# Patient Record
Sex: Female | Born: 1990 | Race: White | Hispanic: No | Marital: Married | Smoking: Former smoker
Health system: Southern US, Community
[De-identification: ages and names within clinical notes are randomized; demographics above are authoritative.]

## PROBLEM LIST (undated history)

## (undated) DIAGNOSIS — R51 Headache: Secondary | ICD-10-CM

## (undated) DIAGNOSIS — G43909 Migraine, unspecified, not intractable, without status migrainosus: Secondary | ICD-10-CM

## (undated) DIAGNOSIS — F329 Major depressive disorder, single episode, unspecified: Secondary | ICD-10-CM

## (undated) DIAGNOSIS — F419 Anxiety disorder, unspecified: Secondary | ICD-10-CM

## (undated) DIAGNOSIS — E039 Hypothyroidism, unspecified: Secondary | ICD-10-CM

## (undated) HISTORY — DX: Major depressive disorder, single episode, unspecified: F32.9

## (undated) HISTORY — PX: WISDOM TOOTH EXTRACTION: SHX21

## (undated) HISTORY — DX: Headache: R51

## (undated) HISTORY — DX: Anxiety disorder, unspecified: F41.9

---
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
## 2008-07-23 ENCOUNTER — Other Ambulatory Visit: Admission: RE | Admit: 2008-07-23 | Discharge: 2008-07-23 | Payer: Self-pay | Admitting: Obstetrics and Gynecology

## 2009-03-08 ENCOUNTER — Encounter: Admission: RE | Admit: 2009-03-08 | Discharge: 2009-03-08 | Payer: Self-pay | Admitting: Endocrinology

## 2009-11-26 ENCOUNTER — Emergency Department (HOSPITAL_COMMUNITY): Admission: EM | Admit: 2009-11-26 | Discharge: 2009-11-26 | Payer: Self-pay | Admitting: Emergency Medicine

## 2010-11-20 ENCOUNTER — Encounter: Payer: Self-pay | Admitting: Specialist

## 2010-11-20 ENCOUNTER — Encounter: Payer: Self-pay | Admitting: Endocrinology

## 2010-12-23 ENCOUNTER — Other Ambulatory Visit: Payer: Self-pay | Admitting: Endocrinology

## 2011-01-09 ENCOUNTER — Other Ambulatory Visit: Payer: Self-pay | Admitting: Endocrinology

## 2011-01-09 DIAGNOSIS — E049 Nontoxic goiter, unspecified: Secondary | ICD-10-CM

## 2011-01-16 LAB — CBC
HCT: 32.3 % — ABNORMAL LOW (ref 36.0–46.0)
Hemoglobin: 10.5 g/dL — ABNORMAL LOW (ref 12.0–15.0)
MCHC: 32.5 g/dL (ref 30.0–36.0)
MCV: 87.6 fL (ref 78.0–100.0)
Platelets: 230 10*3/uL (ref 150–400)
RBC: 3.69 MIL/uL — ABNORMAL LOW (ref 3.87–5.11)
RDW: 13.9 % (ref 11.5–15.5)
WBC: 6.9 10*3/uL (ref 4.0–10.5)

## 2011-01-16 LAB — URINE CULTURE
Colony Count: NO GROWTH
Culture: NO GROWTH

## 2011-01-16 LAB — DIFFERENTIAL
Basophils Absolute: 0 10*3/uL (ref 0.0–0.1)
Basophils Relative: 1 % (ref 0–1)
Eosinophils Absolute: 0.1 10*3/uL (ref 0.0–0.7)
Eosinophils Relative: 1 % (ref 0–5)
Lymphocytes Relative: 42 % (ref 12–46)
Lymphs Abs: 2.8 10*3/uL (ref 0.7–4.0)
Monocytes Absolute: 0.3 10*3/uL (ref 0.1–1.0)
Monocytes Relative: 5 % (ref 3–12)
Neutro Abs: 3.6 10*3/uL (ref 1.7–7.7)
Neutrophils Relative %: 53 % (ref 43–77)

## 2011-01-16 LAB — BASIC METABOLIC PANEL
BUN: 11 mg/dL (ref 6–23)
CO2: 19 mEq/L (ref 19–32)
Calcium: 8.6 mg/dL (ref 8.4–10.5)
Chloride: 113 mEq/L — ABNORMAL HIGH (ref 96–112)
Creatinine, Ser: 0.8 mg/dL (ref 0.4–1.2)
GFR calc Af Amer: >60 mL/min (ref >60)
GFR calc non Af Amer: >60 mL/min (ref >60)
Glucose, Bld: 117 mg/dL — ABNORMAL HIGH (ref 70–99)
Potassium: 3.1 mEq/L — ABNORMAL LOW (ref 3.5–5.1)
Sodium: 140 mEq/L (ref 135–145)

## 2011-01-16 LAB — RAPID URINE DRUG SCREEN, HOSP PERFORMED
Amphetamines: POSITIVE — AB
Barbiturates: NOT DETECTED
Benzodiazepines: NOT DETECTED
Cocaine: NOT DETECTED
Opiates: NOT DETECTED
Tetrahydrocannabinol: NOT DETECTED

## 2011-01-16 LAB — ETHANOL: Alcohol, Ethyl (B): 154 mg/dL — ABNORMAL HIGH (ref 0–10)

## 2011-01-16 LAB — PREGNANCY, URINE: Preg Test, Ur: NEGATIVE

## 2011-02-09 ENCOUNTER — Ambulatory Visit
Admission: RE | Admit: 2011-02-09 | Discharge: 2011-02-09 | Disposition: A | Discharge status: Home / Self Care | Payer: PRIVATE HEALTH INSURANCE | Source: Ambulatory Visit | Attending: Endocrinology | Admitting: Endocrinology

## 2011-02-09 DIAGNOSIS — E049 Nontoxic goiter, unspecified: Secondary | ICD-10-CM

## 2011-08-14 ENCOUNTER — Encounter: Payer: Self-pay | Admitting: Obstetrics and Gynecology

## 2011-08-23 ENCOUNTER — Other Ambulatory Visit: Payer: Self-pay | Admitting: Obstetrics and Gynecology

## 2011-08-23 LAB — OB RESULTS CONSOLE ABO/RH: RH Type: NEGATIVE

## 2011-08-23 LAB — OB RESULTS CONSOLE RUBELLA ANTIBODY, IGM: Rubella: IMMUNE

## 2011-08-23 LAB — OB RESULTS CONSOLE ANTIBODY SCREEN: Antibody Screen: NEGATIVE

## 2011-08-23 LAB — OB RESULTS CONSOLE HEPATITIS B SURFACE ANTIGEN: Hepatitis B Surface Ag: NEGATIVE

## 2011-08-23 LAB — OB RESULTS CONSOLE GC/CHLAMYDIA
Chlamydia: NEGATIVE
Gonorrhea: NEGATIVE

## 2011-08-23 LAB — OB RESULTS CONSOLE HIV ANTIBODY (ROUTINE TESTING): HIV: NONREACTIVE

## 2011-08-23 LAB — OB RESULTS CONSOLE RPR: RPR: NONREACTIVE

## 2011-09-07 ENCOUNTER — Other Ambulatory Visit: Payer: Self-pay | Admitting: Obstetrics and Gynecology

## 2011-09-13 ENCOUNTER — Other Ambulatory Visit: Payer: Self-pay | Admitting: Obstetrics and Gynecology

## 2011-10-05 ENCOUNTER — Other Ambulatory Visit: Payer: Self-pay | Admitting: Obstetrics and Gynecology

## 2011-10-31 NOTE — L&D Delivery Note (Signed)
Delivery Note At 9:21 AM a viable unspecified sex was delivered via Vaginal, Spontaneous Delivery (Presentation: Left Occiput Anterior).  APGAR: 7, 9; weight .   Placenta status: Intact, Spontaneous.3 vessel  Cord:  with the following complications:had to rotate posterior shoulder anteriorly  Nuchal cord  None.  Cord pH: na  Anesthesia: Epidural  Episiotomy: None Lacerations:  Suture Repair: na Est. Blood Loss (mL):   Mom to postpartum.  Baby to nursery-stable.  Abanoub Hanken S 03/23/2012, 9:29 AM

## 2011-11-02 ENCOUNTER — Encounter: Payer: Self-pay | Admitting: Obstetrics and Gynecology

## 2011-11-03 ENCOUNTER — Other Ambulatory Visit (HOSPITAL_COMMUNITY): Payer: Self-pay | Admitting: Obstetrics and Gynecology

## 2011-11-03 DIAGNOSIS — O2690 Pregnancy related conditions, unspecified, unspecified trimester: Secondary | ICD-10-CM

## 2011-11-03 DIAGNOSIS — Z3689 Encounter for other specified antenatal screening: Secondary | ICD-10-CM

## 2011-11-08 ENCOUNTER — Encounter (HOSPITAL_COMMUNITY): Payer: Self-pay

## 2011-11-08 ENCOUNTER — Ambulatory Visit (HOSPITAL_COMMUNITY)
Admission: RE | Admit: 2011-11-08 | Discharge: 2011-11-08 | Disposition: A | Discharge status: Home / Self Care | Payer: PRIVATE HEALTH INSURANCE | Source: Ambulatory Visit | Attending: Obstetrics and Gynecology | Admitting: Obstetrics and Gynecology

## 2011-11-08 DIAGNOSIS — E039 Hypothyroidism, unspecified: Secondary | ICD-10-CM | POA: Insufficient documentation

## 2011-11-08 DIAGNOSIS — Z3689 Encounter for other specified antenatal screening: Secondary | ICD-10-CM

## 2011-11-08 DIAGNOSIS — O352XX0 Maternal care for (suspected) hereditary disease in fetus, not applicable or unspecified: Secondary | ICD-10-CM | POA: Insufficient documentation

## 2011-11-08 DIAGNOSIS — O99280 Endocrine, nutritional and metabolic diseases complicating pregnancy, unspecified trimester: Secondary | ICD-10-CM | POA: Insufficient documentation

## 2011-11-08 DIAGNOSIS — O2690 Pregnancy related conditions, unspecified, unspecified trimester: Secondary | ICD-10-CM

## 2011-11-08 DIAGNOSIS — E079 Disorder of thyroid, unspecified: Secondary | ICD-10-CM | POA: Insufficient documentation

## 2011-11-08 DIAGNOSIS — O358XX0 Maternal care for other (suspected) fetal abnormality and damage, not applicable or unspecified: Secondary | ICD-10-CM | POA: Insufficient documentation

## 2011-11-08 HISTORY — DX: Hypothyroidism, unspecified: E03.9

## 2011-11-08 NOTE — Progress Notes (Signed)
Obstetric ultrasound performed today.  Please see report in [facility].  No fetal anomalies identified.

## 2011-11-20 LAB — US OB DETAIL + 14 WK

## 2011-11-20 LAB — US OB COMP LESS 14 WKS

## 2011-12-20 ENCOUNTER — Ambulatory Visit (HOSPITAL_COMMUNITY)
Admission: RE | Admit: 2011-12-20 | Discharge: 2011-12-20 | Disposition: A | Discharge status: Home / Self Care | Payer: BC Managed Care – PPO | Source: Ambulatory Visit | Attending: Obstetrics and Gynecology | Admitting: Obstetrics and Gynecology

## 2011-12-20 DIAGNOSIS — Z3689 Encounter for other specified antenatal screening: Secondary | ICD-10-CM

## 2011-12-20 DIAGNOSIS — E079 Disorder of thyroid, unspecified: Secondary | ICD-10-CM | POA: Insufficient documentation

## 2011-12-20 DIAGNOSIS — O99280 Endocrine, nutritional and metabolic diseases complicating pregnancy, unspecified trimester: Secondary | ICD-10-CM | POA: Insufficient documentation

## 2011-12-20 DIAGNOSIS — O352XX0 Maternal care for (suspected) hereditary disease in fetus, not applicable or unspecified: Secondary | ICD-10-CM | POA: Insufficient documentation

## 2011-12-20 DIAGNOSIS — O2690 Pregnancy related conditions, unspecified, unspecified trimester: Secondary | ICD-10-CM

## 2011-12-20 DIAGNOSIS — E039 Hypothyroidism, unspecified: Secondary | ICD-10-CM | POA: Insufficient documentation

## 2012-03-05 LAB — OB RESULTS CONSOLE GBS: GBS: NEGATIVE

## 2012-03-11 IMAGING — US US OB FOLLOW-UP
1 series · 14 of 28 positions shown · non-contrast
Comparison: none

[Series 1: us ob follow-up · 0.18mm/px · 14 of 69 slices shown]
[im 3/69]
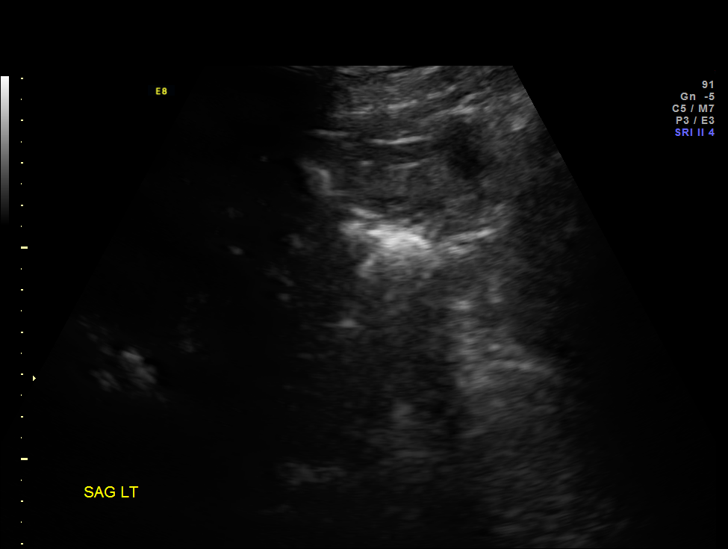
[im 8/69]
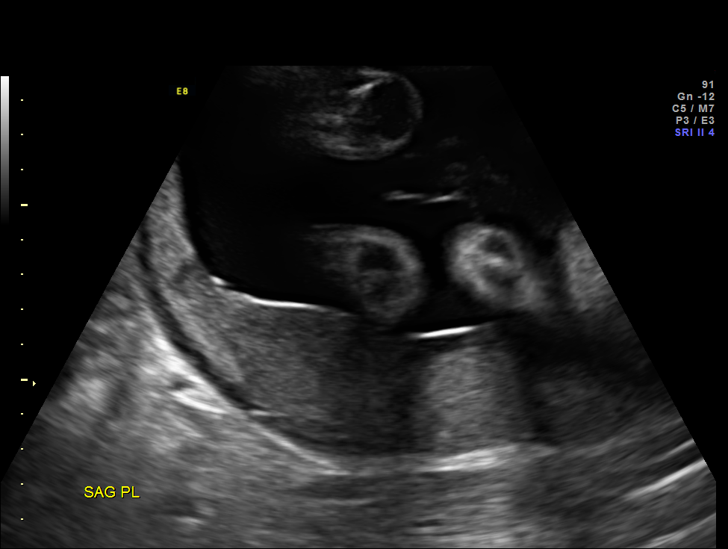
[im 13/69]
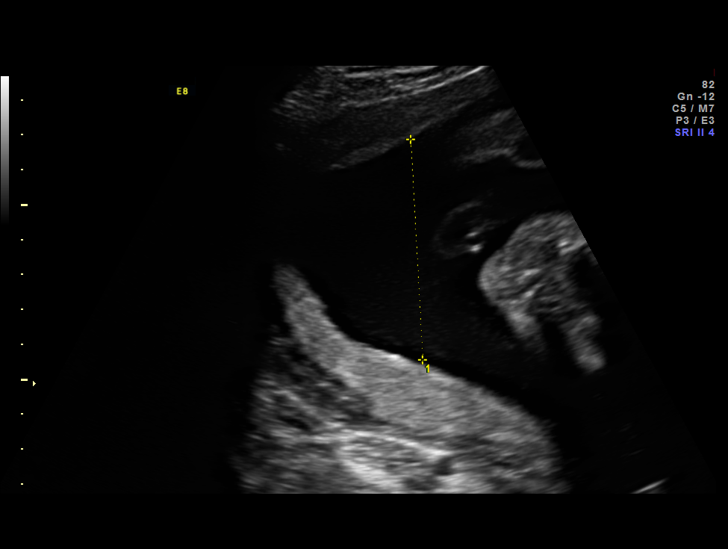
[im 18/69]
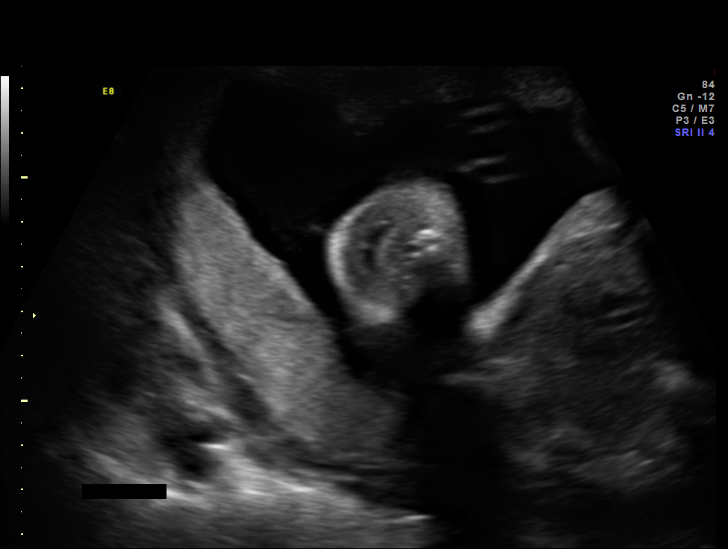
[im 23/69]
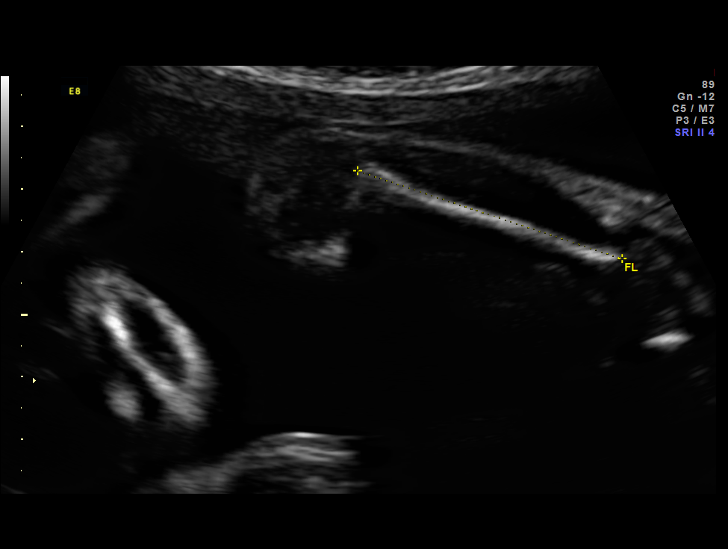
[im 28/69]
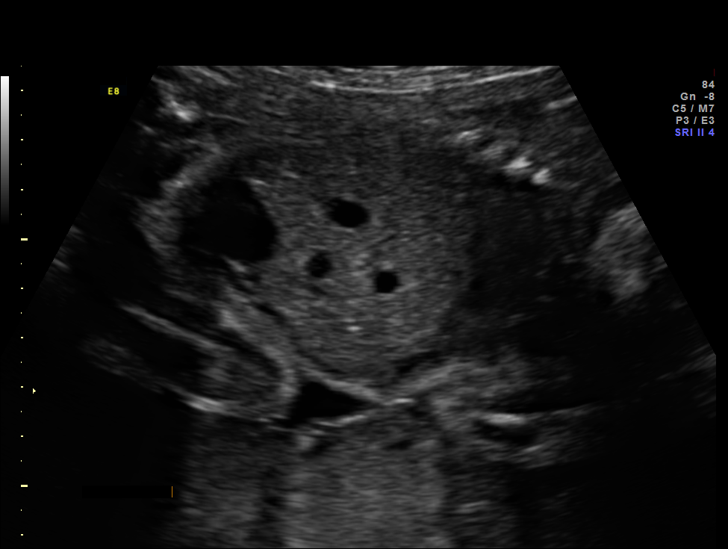
[im 33/69]
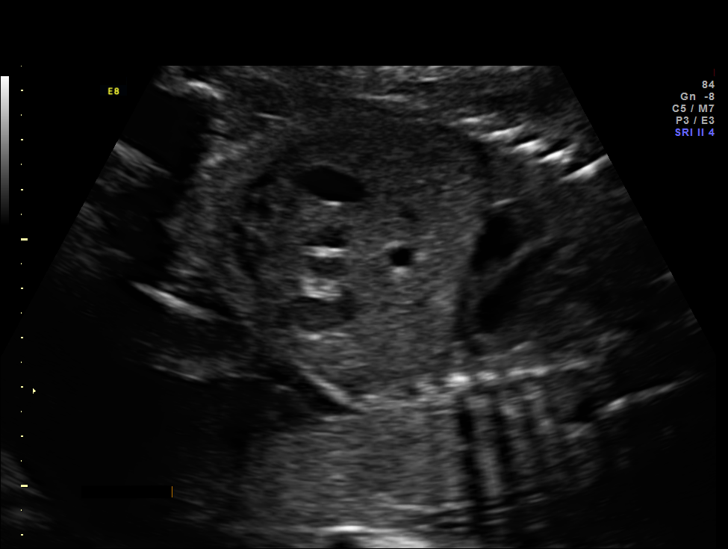
[im 38/69]
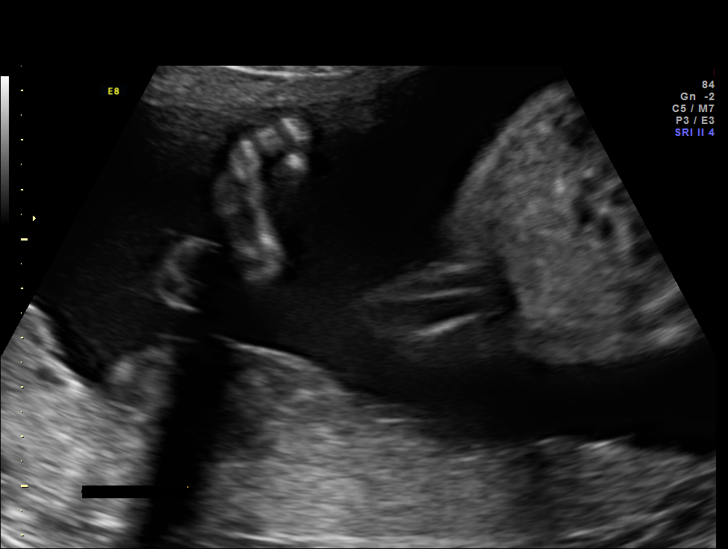
[im 43/69]
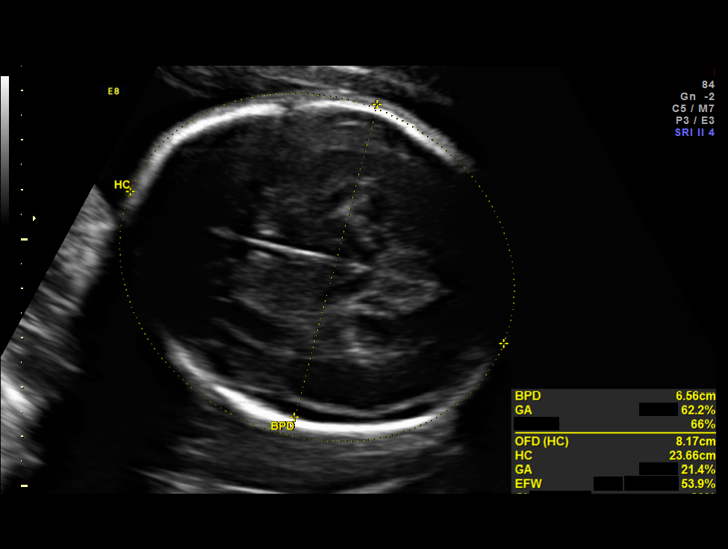
[im 48/69]
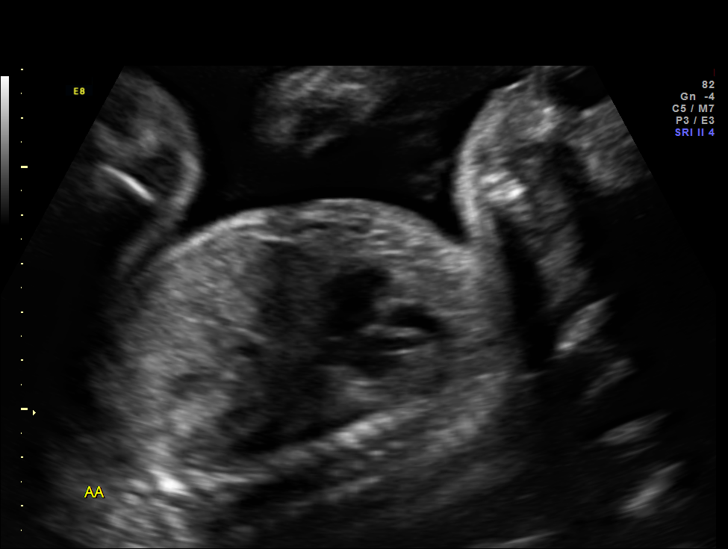
[im 53/69]
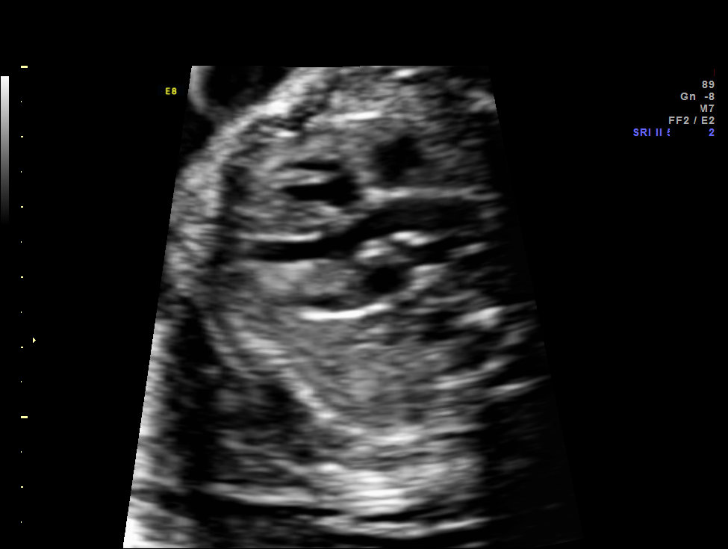
[im 58/69]
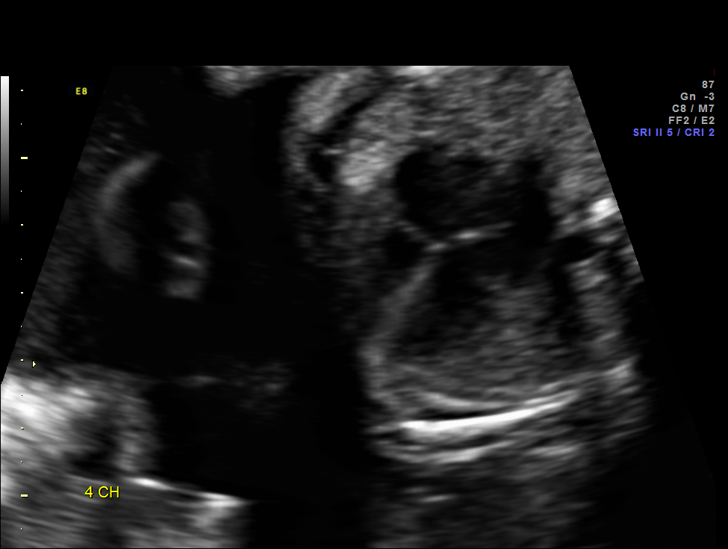
[im 63/69]
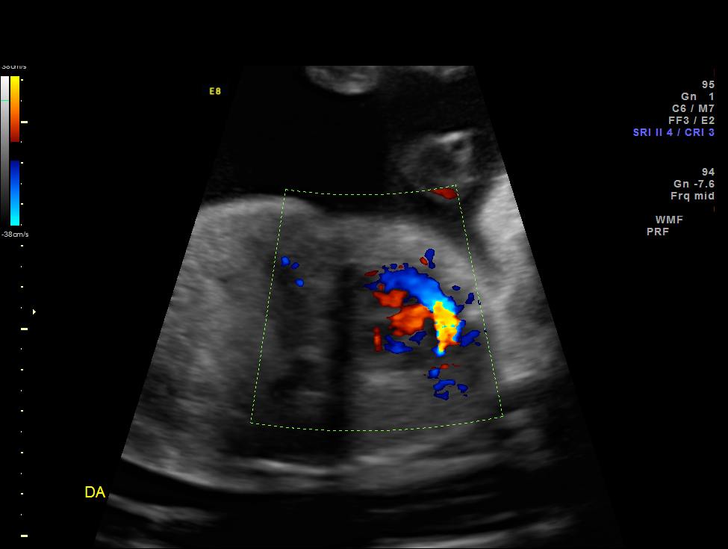
[im 69/69]
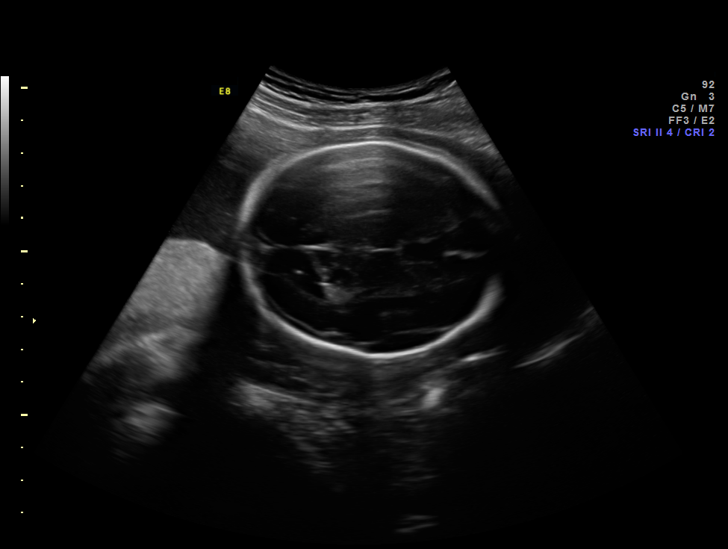

[14 of 28 positions shown; findings below may reference images not displayed]

Canned report from images found in remote index.

Refer to host system for actual result text.

## 2012-03-22 ENCOUNTER — Telehealth (HOSPITAL_COMMUNITY): Payer: Self-pay | Admitting: *Deleted

## 2012-03-22 ENCOUNTER — Encounter (HOSPITAL_COMMUNITY): Payer: Self-pay | Admitting: *Deleted

## 2012-03-22 NOTE — Telephone Encounter (Signed)
Preadmission screen  

## 2012-03-23 ENCOUNTER — Encounter (HOSPITAL_COMMUNITY): Payer: Self-pay | Admitting: Anesthesiology

## 2012-03-23 ENCOUNTER — Encounter (HOSPITAL_COMMUNITY): Payer: Self-pay | Admitting: *Deleted

## 2012-03-23 ENCOUNTER — Inpatient Hospital Stay (HOSPITAL_COMMUNITY): Payer: BC Managed Care – PPO | Admitting: Anesthesiology

## 2012-03-23 ENCOUNTER — Inpatient Hospital Stay (HOSPITAL_COMMUNITY)
Admission: AD | Admit: 2012-03-23 | Discharge: 2012-03-25 | DRG: 373 | Disposition: A | Discharge status: Home / Self Care | Payer: BC Managed Care – PPO | Source: Ambulatory Visit | Attending: Obstetrics and Gynecology | Admitting: Obstetrics and Gynecology

## 2012-03-23 DIAGNOSIS — E079 Disorder of thyroid, unspecified: Principal | ICD-10-CM | POA: Diagnosis present

## 2012-03-23 DIAGNOSIS — E039 Hypothyroidism, unspecified: Secondary | ICD-10-CM | POA: Diagnosis present

## 2012-03-23 LAB — CBC
HCT: 37.5 % (ref 36.0–46.0)
Hemoglobin: 12.2 g/dL (ref 12.0–15.0)
MCH: 27.7 pg (ref 26.0–34.0)
MCHC: 32.5 g/dL (ref 30.0–36.0)
MCV: 85.2 fL (ref 78.0–100.0)
Platelets: 232 10*3/uL (ref 150–400)
RBC: 4.4 MIL/uL (ref 3.87–5.11)
RDW: 14.5 % (ref 11.5–15.5)
WBC: 17.2 10*3/uL — ABNORMAL HIGH (ref 4.0–10.5)

## 2012-03-23 LAB — RPR: RPR Ser Ql: NONREACTIVE

## 2012-03-23 MED ORDER — CITRIC ACID-SODIUM CITRATE 334-500 MG/5ML PO SOLN: 30.0000 mL | ORAL | Status: DC | PRN

## 2012-03-23 MED ORDER — FLEET ENEMA 7-19 GM/118ML RE ENEM: 1.0000 | ENEMA | RECTAL | Status: DC | PRN

## 2012-03-23 MED ORDER — PRENATAL MULTIVITAMIN CH: 1.0000 | ORAL_TABLET | Freq: Every day | ORAL | Status: DC

## 2012-03-23 MED ORDER — LIDOCAINE HCL (PF) 1 % IJ SOLN
30.0000 mL | INTRAMUSCULAR | Status: DC | PRN
  Filled 2012-03-23: qty 30

## 2012-03-23 MED ORDER — LEVOTHYROXINE SODIUM 112 MCG PO TABS
112.0000 ug | ORAL_TABLET | Freq: Every day | ORAL | Status: DC
  Administered 2012-03-23 – 2012-03-25 (×3): 112 ug via ORAL
  Filled 2012-03-23 (×5): qty 1

## 2012-03-23 MED ORDER — OXYTOCIN 20 UNITS IN LACTATED RINGERS INFUSION - SIMPLE
125.0000 mL/h | Freq: Once | INTRAVENOUS | Status: AC
  Administered 2012-03-23: 125 mL/h via INTRAVENOUS

## 2012-03-23 MED ORDER — ONDANSETRON HCL 4 MG/2ML IJ SOLN: 4.0000 mg | INTRAMUSCULAR | Status: DC | PRN

## 2012-03-23 MED ORDER — SIMETHICONE 80 MG PO CHEW: 80.0000 mg | CHEWABLE_TABLET | ORAL | Status: DC | PRN

## 2012-03-23 MED ORDER — ZOLPIDEM TARTRATE 5 MG PO TABS: 5.0000 mg | ORAL_TABLET | Freq: Every evening | ORAL | Status: DC | PRN

## 2012-03-23 MED ORDER — LACTATED RINGERS IV SOLN
INTRAVENOUS | Status: DC
  Administered 2012-03-23 (×3): via INTRAVENOUS

## 2012-03-23 MED ORDER — LEVOTHYROXINE SODIUM 100 MCG PO TABS
100.0000 ug | ORAL_TABLET | Freq: Every day | ORAL | Status: DC
  Filled 2012-03-23 (×2): qty 1

## 2012-03-23 MED ORDER — DIBUCAINE 1 % RE OINT: 1.0000 "application " | TOPICAL_OINTMENT | RECTAL | Status: DC | PRN

## 2012-03-23 MED ORDER — LACTATED RINGERS IV SOLN
500.0000 mL | Freq: Once | INTRAVENOUS | Status: AC
  Administered 2012-03-23: 1000 mL via INTRAVENOUS

## 2012-03-23 MED ORDER — IBUPROFEN 600 MG PO TABS: 600.0000 mg | ORAL_TABLET | Freq: Four times a day (QID) | ORAL | Status: DC | PRN

## 2012-03-23 MED ORDER — PHENYLEPHRINE 40 MCG/ML (10ML) SYRINGE FOR IV PUSH (FOR BLOOD PRESSURE SUPPORT): 80.0000 ug | PREFILLED_SYRINGE | INTRAVENOUS | Status: DC | PRN

## 2012-03-23 MED ORDER — BENZOCAINE-MENTHOL 20-0.5 % EX AERO
1.0000 "application " | INHALATION_SPRAY | CUTANEOUS | Status: DC | PRN
  Filled 2012-03-23: qty 56

## 2012-03-23 MED ORDER — EPHEDRINE 5 MG/ML INJ
10.0000 mg | INTRAVENOUS | Status: DC | PRN
  Filled 2012-03-23: qty 4

## 2012-03-23 MED ORDER — OXYCODONE-ACETAMINOPHEN 5-325 MG PO TABS
1.0000 | ORAL_TABLET | ORAL | Status: DC | PRN
  Filled 2012-03-23: qty 1

## 2012-03-23 MED ORDER — PRENATAL MULTIVITAMIN CH
1.0000 | ORAL_TABLET | Freq: Every day | ORAL | Status: DC
  Administered 2012-03-23 – 2012-03-25 (×3): 1 via ORAL
  Filled 2012-03-23 (×3): qty 1

## 2012-03-23 MED ORDER — ONDANSETRON HCL 4 MG/2ML IJ SOLN: 4.0000 mg | Freq: Four times a day (QID) | INTRAMUSCULAR | Status: DC | PRN

## 2012-03-23 MED ORDER — PHENYLEPHRINE 40 MCG/ML (10ML) SYRINGE FOR IV PUSH (FOR BLOOD PRESSURE SUPPORT)
80.0000 ug | PREFILLED_SYRINGE | INTRAVENOUS | Status: DC | PRN
  Filled 2012-03-23: qty 5

## 2012-03-23 MED ORDER — FENTANYL 2.5 MCG/ML BUPIVACAINE 1/10 % EPIDURAL INFUSION (WH - ANES)
INTRAMUSCULAR | Status: DC | PRN
  Administered 2012-03-23: 14 mL/h via EPIDURAL

## 2012-03-23 MED ORDER — OXYCODONE-ACETAMINOPHEN 5-325 MG PO TABS: 1.0000 | ORAL_TABLET | ORAL | Status: DC | PRN

## 2012-03-23 MED ORDER — OXYTOCIN BOLUS FROM INFUSION
500.0000 mL | Freq: Once | INTRAVENOUS | Status: DC
  Filled 2012-03-23: qty 500
  Filled 2012-03-23: qty 1000

## 2012-03-23 MED ORDER — BISACODYL 10 MG RE SUPP: 10.0000 mg | Freq: Every day | RECTAL | Status: DC | PRN

## 2012-03-23 MED ORDER — DIPHENHYDRAMINE HCL 50 MG/ML IJ SOLN: 12.5000 mg | INTRAMUSCULAR | Status: DC | PRN

## 2012-03-23 MED ORDER — SODIUM BICARBONATE 8.4 % IV SOLN
INTRAVENOUS | Status: DC | PRN
  Administered 2012-03-23: 4 mL via EPIDURAL

## 2012-03-23 MED ORDER — ACETAMINOPHEN 325 MG PO TABS: 650.0000 mg | ORAL_TABLET | ORAL | Status: DC | PRN

## 2012-03-23 MED ORDER — LANOLIN HYDROUS EX OINT: TOPICAL_OINTMENT | CUTANEOUS | Status: DC | PRN

## 2012-03-23 MED ORDER — EPHEDRINE 5 MG/ML INJ: 10.0000 mg | INTRAVENOUS | Status: DC | PRN

## 2012-03-23 MED ORDER — SENNOSIDES-DOCUSATE SODIUM 8.6-50 MG PO TABS
2.0000 | ORAL_TABLET | Freq: Every day | ORAL | Status: DC
  Administered 2012-03-23 – 2012-03-24 (×2): 2 via ORAL

## 2012-03-23 MED ORDER — IBUPROFEN 600 MG PO TABS
600.0000 mg | ORAL_TABLET | Freq: Four times a day (QID) | ORAL | Status: DC
  Administered 2012-03-23 – 2012-03-25 (×8): 600 mg via ORAL
  Filled 2012-03-23 (×6): qty 1

## 2012-03-23 MED ORDER — WITCH HAZEL-GLYCERIN EX PADS: 1.0000 "application " | MEDICATED_PAD | CUTANEOUS | Status: DC | PRN

## 2012-03-23 MED ORDER — FLEET ENEMA 7-19 GM/118ML RE ENEM: 1.0000 | ENEMA | Freq: Every day | RECTAL | Status: DC | PRN

## 2012-03-23 MED ORDER — DIPHENHYDRAMINE HCL 25 MG PO CAPS: 25.0000 mg | ORAL_CAPSULE | Freq: Four times a day (QID) | ORAL | Status: DC | PRN

## 2012-03-23 MED ORDER — ONDANSETRON HCL 4 MG PO TABS: 4.0000 mg | ORAL_TABLET | ORAL | Status: DC | PRN

## 2012-03-23 MED ORDER — LACTATED RINGERS IV SOLN
500.0000 mL | INTRAVENOUS | Status: DC | PRN
  Administered 2012-03-23: 500 mL via INTRAVENOUS

## 2012-03-23 MED ORDER — TETANUS-DIPHTH-ACELL PERTUSSIS 5-2.5-18.5 LF-MCG/0.5 IM SUSP: 0.5000 mL | Freq: Once | INTRAMUSCULAR | Status: DC

## 2012-03-23 MED ORDER — FENTANYL 2.5 MCG/ML BUPIVACAINE 1/10 % EPIDURAL INFUSION (WH - ANES)
14.0000 mL/h | INTRAMUSCULAR | Status: DC
  Administered 2012-03-23: 14 mL/h via EPIDURAL
  Filled 2012-03-23 (×2): qty 60

## 2012-03-23 NOTE — MAU Note (Signed)
Dr Arelia Sneddon notified of pts arrival and ROM, fern positive and GBS Neg. Pt having contractions every 3-5 minutes. Admit orders received.

## 2012-03-23 NOTE — H&P (Signed)
Brooke Galvan is a 21 y.o. female presenting at 53.2 with SROM. Negative GBS. Maternal Medical History:  Reason for admission: Reason for admission: rupture of membranes.  Contractions: Onset was 1-2 hours ago.   Frequency: regular.   Perceived severity is moderate.    Fetal activity: Perceived fetal activity is normal.    Prenatal Complications - Diabetes: none.    OB History    Grav Para Term Preterm Abortions TAB SAB Ect Mult Living   2 0 0 0 1 1 0 0 0 0      Past Medical History  Diagnosis Date  . Hypothyroidism   . Headache   . Anxiety   . Asthma     sports induced no meds now   Past Surgical History  Procedure Date  . Breast surgery 2007    aug  . Wisdom tooth extraction    Family History: family history includes Birth defects in her brother; Heart disease in her maternal grandmother; and Hypertension in her maternal grandfather.  There is no history of Anesthesia problems. Social History:  reports that she quit smoking about 2 years ago. She has never used smokeless tobacco. She reports that she does not drink alcohol or use illicit drugs.  ROS  Dilation: 6 Effacement (%): 100 Station: 0 Exam by:: K Fraley RN Blood pressure 115/73, pulse 102, temperature 98.7 F (37.1 C), temperature source Oral, resp. rate 18, height 5\' 4"  (1.626 m), weight 75.354 kg (166 lb 2 oz), last menstrual period 06/22/2011, SpO2 98.00%. Maternal Exam:  Uterine Assessment: Contraction strength is moderate.  Contraction frequency is regular.   Abdomen: Patient reports no abdominal tenderness. Fundal height is c/w dates.   Estimated fetal weight is 7.    Introitus: Amniotic fluid character: clear.  Pelvis: adequate for delivery.   Cervix: Cervix evaluated by digital exam.     Physical Exam  Prenatal labs: ABO, Rh: A/Negative/-- (10/24 0000) Antibody: Negative (10/24 0000) Rubella: Immune (10/24 0000) RPR: Nonreactive (10/24 0000)  HBsAg: Negative (10/24 0000)  HIV:  Non-reactive (10/24 0000)  GBS: Negative (05/07 0000)   Assessment/Plan: IUP at term with SROM. Routine Labor and DElivery.   Lillyana Majette S 03/23/2012, 5:18 AM

## 2012-03-23 NOTE — Anesthesia Preprocedure Evaluation (Signed)
Anesthesia Evaluation  Patient identified by MRN, date of birth, ID band Patient awake    Reviewed: Allergy & Precautions, H&P , Patient's Chart, lab work & pertinent test results  Airway Mallampati: II  TM Distance: >3 FB Neck ROM: full    Dental  (+) Teeth Intact   Pulmonary asthma ,    breath sounds clear to auscultation       Cardiovascular  Rhythm:regular Rate:Normal     Neuro/Psych    GI/Hepatic   Endo/Other    Renal/GU      Musculoskeletal   Abdominal   Peds  Hematology   Anesthesia Other Findings       Reproductive/Obstetrics (+) Pregnancy                             Anesthesia Physical Anesthesia Plan  ASA: II  Anesthesia Plan: Epidural   Post-op Pain Management:    Induction:   Airway Management Planned:   Additional Equipment:   Intra-op Plan:   Post-operative Plan:   Informed Consent: I have reviewed the patients History and Physical, chart, labs and discussed the procedure including the risks, benefits and alternatives for the proposed anesthesia with the patient or authorized representative who has indicated his/her understanding and acceptance.   Dental Advisory Given  Plan Discussed with:   Anesthesia Plan Comments: (Labs checked- platelets confirmed with RN in room. Fetal heart tracing, per RN, reported to be stable enough for sitting procedure. Discussed epidural, and patient consents to the procedure:  included risk of possible headache,backache, failed block, allergic reaction, and nerve injury. This patient was asked if she had any questions or concerns before the procedure started.)        Anesthesia Quick Evaluation  

## 2012-03-23 NOTE — Anesthesia Procedure Notes (Signed)

## 2012-03-23 NOTE — Anesthesia Postprocedure Evaluation (Signed)
  Anesthesia Post-op Note  Patient: Brooke Galvan  Procedure(s) Performed: * No procedures listed *  Patient Location: PACU and Mother/Baby  Anesthesia Type: Epidural  Level of Consciousness: awake, alert  and oriented  Airway and Oxygen Therapy: Patient Spontanous Breathing   Post-op Assessment: Patient's Cardiovascular Status Stable and Respiratory Function Stable  Post-op Vital Signs: stable  Complications: No apparent anesthesia complications

## 2012-03-23 NOTE — MAU Note (Signed)
Pt states, " I've had trickles at 5 pm, and then it became more of it recently. I 'm having contractions every ten minutes."

## 2012-03-24 ENCOUNTER — Inpatient Hospital Stay (HOSPITAL_COMMUNITY): Admission: RE | Admit: 2012-03-24 | Payer: BC Managed Care – PPO | Source: Ambulatory Visit

## 2012-03-24 LAB — CBC
HCT: 35.8 % — ABNORMAL LOW (ref 36.0–46.0)
Hemoglobin: 11.5 g/dL — ABNORMAL LOW (ref 12.0–15.0)
MCH: 27.9 pg (ref 26.0–34.0)
MCHC: 32.1 g/dL (ref 30.0–36.0)
MCV: 86.9 fL (ref 78.0–100.0)
Platelets: 193 10*3/uL (ref 150–400)
RBC: 4.12 MIL/uL (ref 3.87–5.11)
RDW: 14.8 % (ref 11.5–15.5)
WBC: 12.9 10*3/uL — ABNORMAL HIGH (ref 4.0–10.5)

## 2012-03-24 MED ORDER — RHO D IMMUNE GLOBULIN 1500 UNIT/2ML IJ SOLN
300.0000 ug | Freq: Once | INTRAMUSCULAR | Status: AC
  Administered 2012-03-24: 300 ug via INTRAMUSCULAR
  Filled 2012-03-24: qty 2

## 2012-03-24 NOTE — Progress Notes (Signed)
Post Partum Day one Subjective: no complaints  Objective: Blood pressure 118/63, pulse 69, temperature 98.5 F (36.9 C), temperature source Oral, resp. rate 18, height 5\' 4"  (1.626 m), weight 75.354 kg (166 lb 2 oz), last menstrual period 06/22/2011, SpO2 100.00%, unknown if currently breastfeeding.  Physical Exam:  General: alert Lochia: appropriate Uterine Fundus: firm Incision: none DVT Evaluation: No evidence of DVT seen on physical exam.   Basename 03/24/12 0530 03/23/12 0222  HGB 11.5* 12.2  HCT 35.8* 37.5    Assessment/Plan: Plan for discharge tomorrow   LOS: 1 day   Ila Landowski S 03/24/2012, 9:04 AM

## 2012-03-25 LAB — RH IG WORKUP (INCLUDES ABO/RH)
ABO/RH(D): A NEG
Antibody Screen: POSITIVE
DAT, IgG: NEGATIVE
Fetal Screen: NEGATIVE
Gestational Age(Wks): 39.2
Unit division: 0

## 2012-03-25 MED ORDER — OXYCODONE-ACETAMINOPHEN 5-325 MG PO TABS: 1.0000 | ORAL_TABLET | Freq: Four times a day (QID) | ORAL | Status: AC | PRN

## 2012-03-25 MED ORDER — IBUPROFEN 600 MG PO TABS: 600.0000 mg | ORAL_TABLET | Freq: Four times a day (QID) | ORAL | Status: AC | PRN

## 2012-03-25 NOTE — Discharge Summary (Signed)
Obstetric Discharge Summary Reason for Admission: rupture of membranes Prenatal Procedures: ultrasound Intrapartum Procedures: spontaneous vaginal delivery Postpartum Procedures: none Complications-Operative and Postpartum: none Hemoglobin  Date Value Range Status  03/24/2012 11.5* 12.0-15.0 (g/dL) Final     HCT  Date Value Range Status  03/24/2012 35.8* 36.0-46.0 (%) Final    Physical Exam:  General: alert and cooperative Lochia: appropriate Uterine Fundus: firm Incision: N/A DVT Evaluation: No evidence of DVT seen on physical exam.  Discharge Diagnoses: Term Pregnancy-delivered  Discharge Information: Date: 03/25/2012 Activity: pelvic rest Diet: routine Medications: PNV, Ibuprofen and Percocet Condition: stable Instructions: refer to practice specific booklet Discharge to: home Follow-up Information    Call in 6 weeks to follow up.         Newborn Data: Live born female  Birth Weight: 8 lb 4.3 oz (3750 g) APGAR: 7, 9  Home with mother.  Brooke Galvan,Shad Ledvina E 03/25/2012, 8:44 AM

## 2012-03-25 NOTE — Progress Notes (Signed)
Pt. Is discharged in the care of husband. Downstairs per ambulatory.Stable. Denies any excessive vaginal bleeding. Or pain . Infant discharged in Mothers arm.

## 2012-03-25 NOTE — Progress Notes (Signed)
No c/o, decreasing lochia, good pain relief, voiding  Blood pressure 117/75, pulse 80, temperature 98.2 F (36.8 C), temperature source Oral, resp. rate 18, height 5\' 4"  (1.626 m), weight 75.354 kg (166 lb 2 oz), last menstrual period 06/22/2011, SpO2 100.00%, unknown if currently breastfeeding.  FFNT    Results for orders placed during the hospital encounter of 03/23/12 (from the past 48 hour(s))  CBC     Status: Abnormal   Collection Time   03/24/12  5:30 AM      Component Value Range Comment   WBC 12.9 (*) 4.0 - 10.5 (K/uL)    RBC 4.12  3.87 - 5.11 (MIL/uL)    Hemoglobin 11.5 (*) 12.0 - 15.0 (g/dL)    HCT 40.9 (*) 81.1 - 46.0 (%)    MCV 86.9  78.0 - 100.0 (fL)    MCH 27.9  26.0 - 34.0 (pg)    MCHC 32.1  30.0 - 36.0 (g/dL)    RDW 91.4  78.2 - 95.6 (%)    Platelets 193  150 - 400 (K/uL)   RH IG WORKUP (INCLUDES ABO/RH)     Status: Normal (Preliminary result)   Collection Time   03/24/12  5:30 AM      Component Value Range Comment   Gestational Age(Wks) 39.2      ABO/RH(D) A NEG      Antibody Screen POS      DAT, IgG NEG      Fetal Screen NEG      Antibody Identification PASSIVELY ACQUIRED ANTI-D      Unit Number [id number]      Blood Component Type RHIG      Unit division 00      Status of Unit ISSUED      Transfusion Status OK TO TRANSFUSE     A: satisfactory P:  D/C home      Instructions given

## 2012-03-28 ENCOUNTER — Inpatient Hospital Stay (HOSPITAL_COMMUNITY): Admit: 2012-03-28 | Payer: Self-pay | Admitting: Obstetrics and Gynecology

## 2013-07-08 ENCOUNTER — Other Ambulatory Visit: Payer: Self-pay | Admitting: Endocrinology

## 2013-07-08 DIAGNOSIS — E049 Nontoxic goiter, unspecified: Secondary | ICD-10-CM

## 2013-07-10 ENCOUNTER — Ambulatory Visit
Admission: RE | Admit: 2013-07-10 | Discharge: 2013-07-10 | Disposition: A | Discharge status: Home / Self Care | Payer: BC Managed Care – PPO | Source: Ambulatory Visit | Attending: Endocrinology | Admitting: Endocrinology

## 2013-07-10 DIAGNOSIS — E049 Nontoxic goiter, unspecified: Secondary | ICD-10-CM

## 2013-10-30 NOTE — L&D Delivery Note (Signed)
Delivery Note At 5:37 PM a viable female was delivered via Vaginal, Spontaneous Delivery (Presentation: ;  ).  APGAR: 9, 9; weight .   Placenta status: Intact, Spontaneous.  Cord: 3 vessels with the following complications: None.  Cord pH: not sent  Anesthesia: Epidural  Episiotomy: None Lacerations: Periurethral;1st degree;Perineal Suture Repair: 3.0 vicryl rapide Est. Blood Loss (mL): 300  Mom to postpartum.  Baby to Couplet care / Skin to Skin.  Meriel PicaHOLLAND,Marjorie Deprey M 06/17/2014, 5:54 PM

## 2013-11-17 LAB — OB RESULTS CONSOLE GC/CHLAMYDIA
Chlamydia: NEGATIVE
Gonorrhea: NEGATIVE

## 2013-11-17 LAB — OB RESULTS CONSOLE HIV ANTIBODY (ROUTINE TESTING): HIV: NONREACTIVE

## 2013-11-17 LAB — OB RESULTS CONSOLE RPR: RPR: NONREACTIVE

## 2013-11-17 LAB — OB RESULTS CONSOLE ABO/RH: RH Type: NEGATIVE

## 2013-11-17 LAB — OB RESULTS CONSOLE RUBELLA ANTIBODY, IGM: Rubella: IMMUNE

## 2013-11-17 LAB — OB RESULTS CONSOLE ANTIBODY SCREEN: Antibody Screen: NEGATIVE

## 2013-11-17 LAB — OB RESULTS CONSOLE HEPATITIS B SURFACE ANTIGEN: Hepatitis B Surface Ag: NEGATIVE

## 2013-12-15 ENCOUNTER — Other Ambulatory Visit: Payer: Self-pay

## 2013-12-30 ENCOUNTER — Other Ambulatory Visit (HOSPITAL_COMMUNITY): Payer: Self-pay | Admitting: Obstetrics and Gynecology

## 2013-12-30 DIAGNOSIS — Z87898 Personal history of other specified conditions: Secondary | ICD-10-CM

## 2013-12-30 DIAGNOSIS — Z3689 Encounter for other specified antenatal screening: Secondary | ICD-10-CM

## 2013-12-31 ENCOUNTER — Encounter (HOSPITAL_COMMUNITY): Payer: Self-pay | Admitting: Obstetrics and Gynecology

## 2014-01-21 ENCOUNTER — Ambulatory Visit (HOSPITAL_COMMUNITY)
Admission: RE | Admit: 2014-01-21 | Discharge: 2014-01-21 | Disposition: A | Discharge status: Home / Self Care | Payer: BC Managed Care – PPO | Source: Ambulatory Visit | Attending: Obstetrics and Gynecology | Admitting: Obstetrics and Gynecology

## 2014-01-21 ENCOUNTER — Encounter (HOSPITAL_COMMUNITY): Payer: Self-pay

## 2014-01-21 DIAGNOSIS — O99280 Endocrine, nutritional and metabolic diseases complicating pregnancy, unspecified trimester: Secondary | ICD-10-CM

## 2014-01-21 DIAGNOSIS — Z3689 Encounter for other specified antenatal screening: Secondary | ICD-10-CM

## 2014-01-21 DIAGNOSIS — O358XX0 Maternal care for other (suspected) fetal abnormality and damage, not applicable or unspecified: Secondary | ICD-10-CM | POA: Insufficient documentation

## 2014-01-21 DIAGNOSIS — O352XX0 Maternal care for (suspected) hereditary disease in fetus, not applicable or unspecified: Secondary | ICD-10-CM | POA: Insufficient documentation

## 2014-01-21 DIAGNOSIS — Z363 Encounter for antenatal screening for malformations: Secondary | ICD-10-CM | POA: Insufficient documentation

## 2014-01-21 DIAGNOSIS — Z87898 Personal history of other specified conditions: Secondary | ICD-10-CM

## 2014-01-21 DIAGNOSIS — E039 Hypothyroidism, unspecified: Secondary | ICD-10-CM | POA: Insufficient documentation

## 2014-01-21 DIAGNOSIS — E079 Disorder of thyroid, unspecified: Secondary | ICD-10-CM | POA: Insufficient documentation

## 2014-01-21 DIAGNOSIS — Z1389 Encounter for screening for other disorder: Secondary | ICD-10-CM | POA: Insufficient documentation

## 2014-04-13 IMAGING — US US OB DETAIL+14 WK
1 series · 12 of 28 positions shown · non-contrast
Comparison: none

[Series 1: us ob detail+14 wk · 0.19mm/px · 12 of 73 slices shown]
[im 3/73]
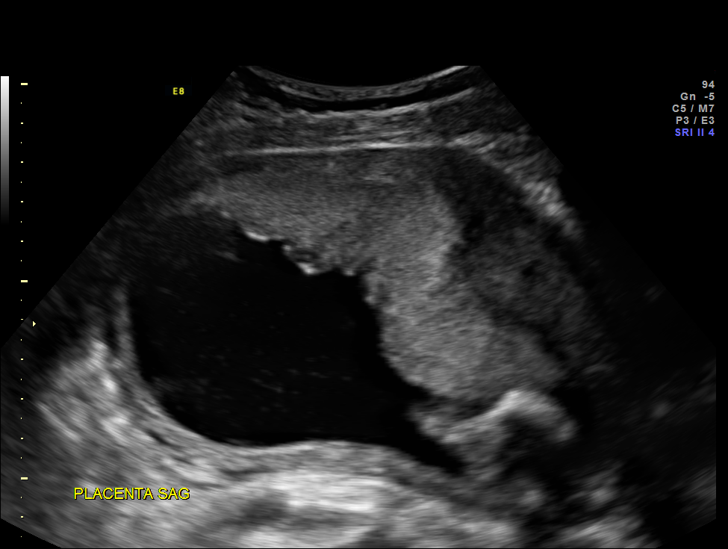
[im 9/73]
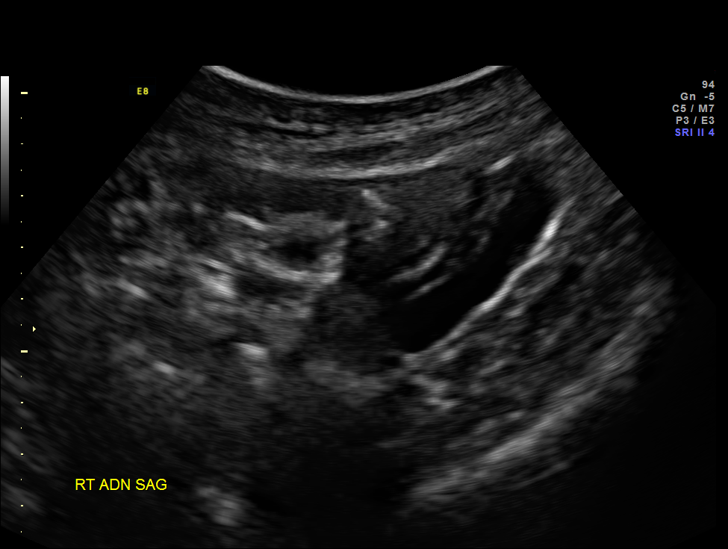
[im 14/73]
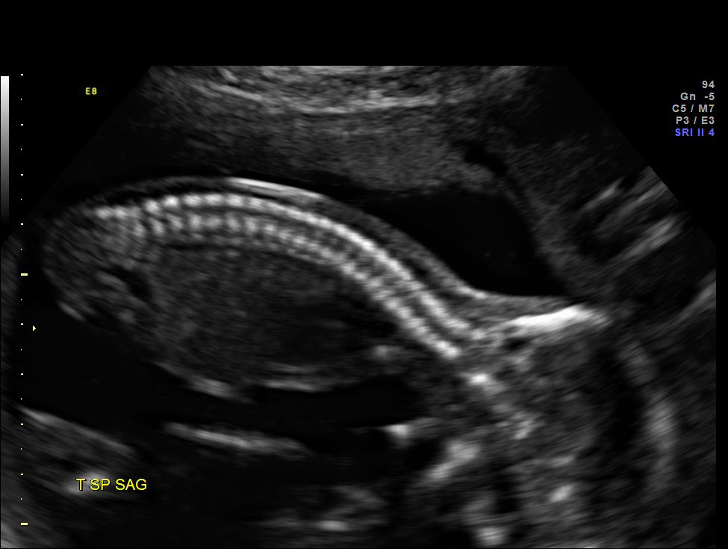
[im 22/73]
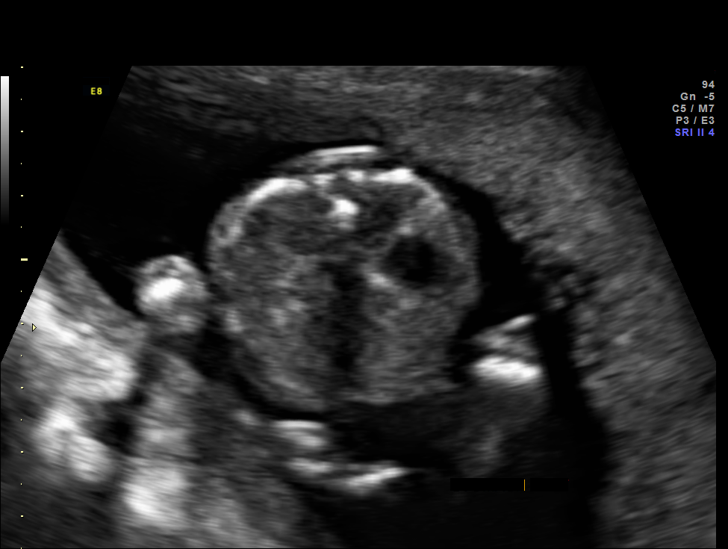
[im 27/73]
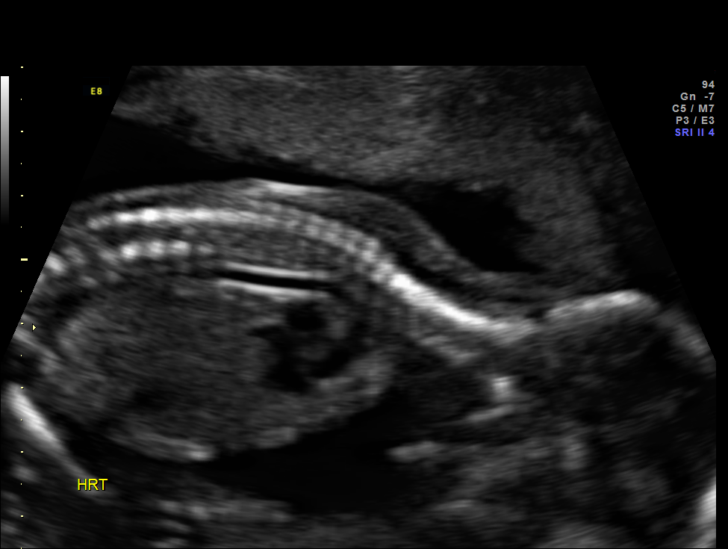
[im 33/73]
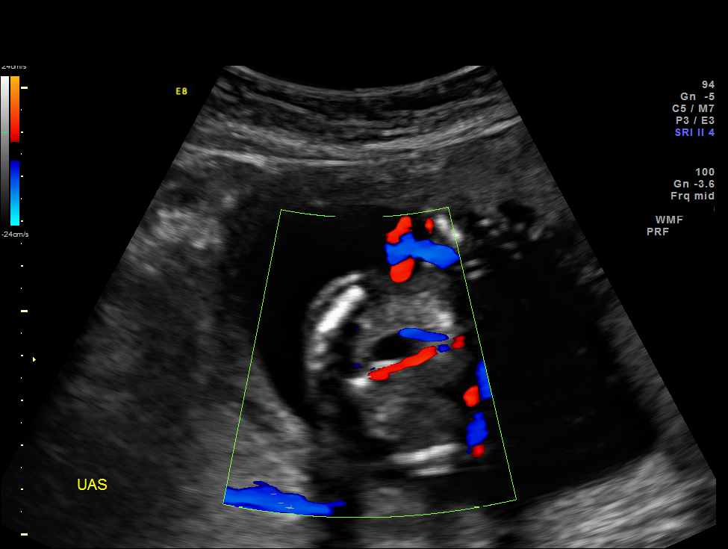
[im 41/73]
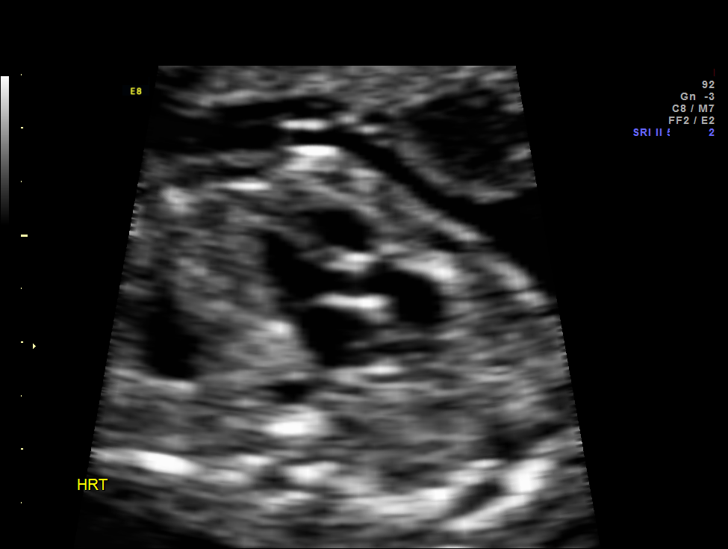
[im 46/73]
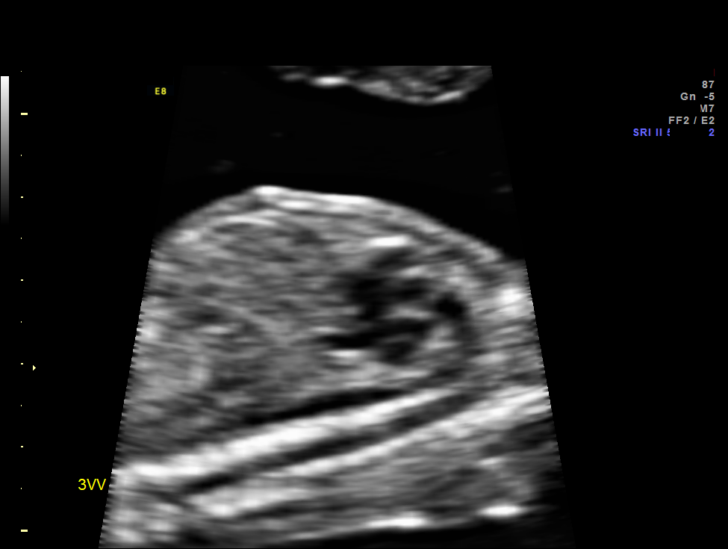
[im 51/73]
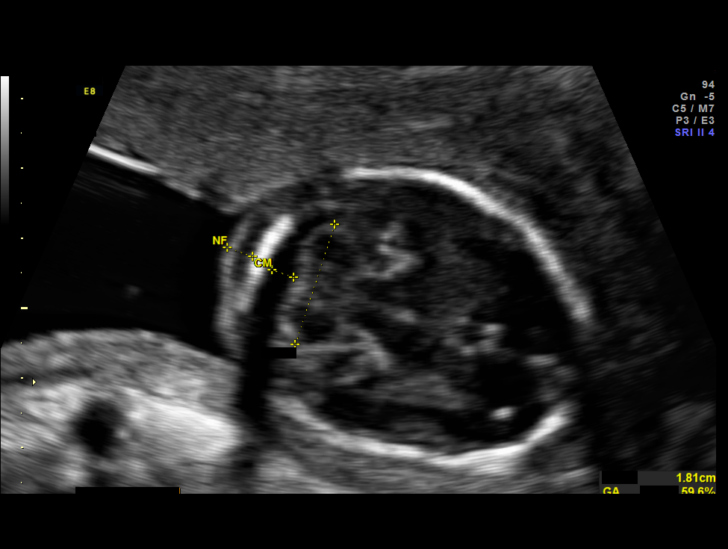
[im 59/73]
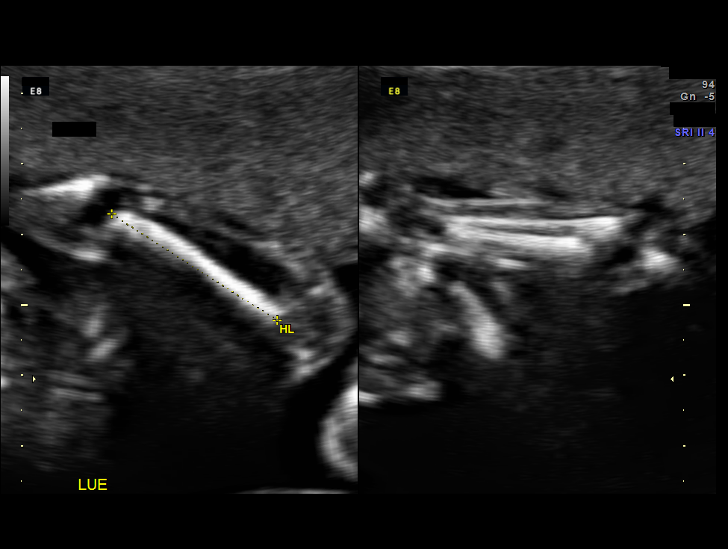
[im 65/73]
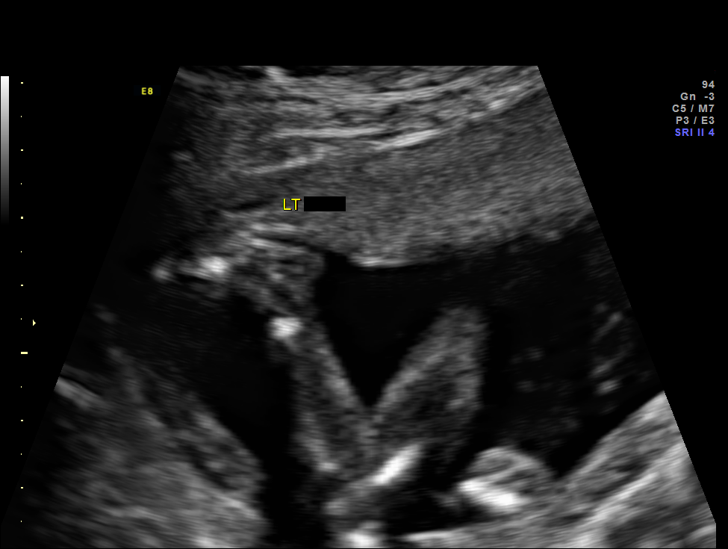
[im 70/73]
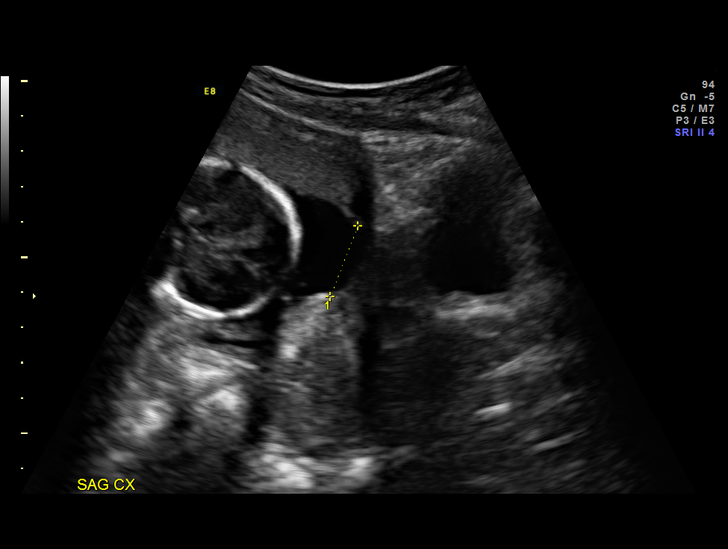

[12 of 28 positions shown; findings below may reference images not displayed]

OBSTETRICS REPORT
                      (Signed Final 01/21/2014 [DATE])

Service(s) Provided

 US OB DETAIL + 14 WK                                  76811.0
Indications

 Detailed fetal anatomic survey
 Hypothyroid
 Rh negative
 History of anatomic abnormality - spina bifida,
 anencephaly (pt's siblings)

Fetal Evaluation

 Num Of Fetuses:    1
 Fetal Heart Rate:  138                          bpm
 Cardiac Activity:  Observed
 Presentation:      Cephalic
 Placenta:          Anterior, above cervical os
 P. Cord            Visualized, central
 Insertion:

 Amniotic Fluid
 AFI FV:      Subjectively within normal limits
                                             Larg Pckt:     4.9  cm
Biometry

 BPD:     38.8  mm     G. Age:  17w 6d                CI:         72.9   70 - 86
 OFD:     53.2  mm                                    FL/HC:      18.1   15.8 -
                                                                         18
 HC:     149.3  mm     G. Age:  18w 0d       50  %    HC/AC:      1.13   1.07 -

 AC:     131.7  mm     G. Age:  18w 5d       75  %    FL/BPD:
 FL:        27  mm     G. Age:  18w 1d       59  %    FL/AC:      20.5   20 - 24
 HUM:       28  mm     G. Age:  19w 0d       87  %
 CER:     18.1  mm     G. Age:  18w 0d       54  %
 NFT:      3.8  mm

 Est. FW:     238  gm      0 lb 8 oz     59  %
Gestational Age

 LMP:           17w 6d        Date:  09/18/13                 EDD:   06/25/14
 U/S Today:     18w 1d                                        EDD:   06/23/14
 Best:          17w 6d     Det. By:  LMP  (09/18/13)          EDD:   06/25/14
Anatomy

 Cranium:          Appears normal         Aortic Arch:      Appears normal
 Fetal Cavum:      Appears normal         Ductal Arch:      Appears normal
 Ventricles:       Appears normal         Diaphragm:        Appears normal
 Choroid Plexus:   Appears normal         Stomach:          Appears normal
 Cerebellum:       Appears normal         Abdomen:          Appears normal
 Posterior Fossa:  Appears normal         Abdominal Wall:   Appears nml (cord
                                                            insert, abd wall)
 Nuchal Fold:      Appears normal         Cord Vessels:     Appears normal (3
                                                            vessel cord)
 Face:             Appears normal         Kidneys:          Appear normal
                   (orbits and profile)
 Lips:             Appears normal         Bladder:          Appears normal
 Heart:            Appears normal         Spine:            Appears normal
                   (4CH, axis, and
                   situs)
 RVOT:             Appears normal         Lower             Appears normal
                                          Extremities:
 LVOT:             See comments           Upper             Appears normal
                                          Extremities:

 Other:  Parents do not wish to know sex of fetus today. Heels visualized.
Targeted Anatomy

 Fetal Central Nervous System
 Cisterna Magna:
Cervix Uterus Adnexa

 Cervical Length:    3.7      cm

 Cervix:       Normal appearance by transabdominal scan.

 Adnexa:     No abnormality visualized.
Impression

 SIUP at 17+6 weeks
 Normal detailed fetal anatomy; ? slight dilation of ascending
 aorta; no findings c/w neural tube defect
 Markers of aneuploidy: none
 Normal amniotic fluid volume
 Measurements consistent with LMP dating

 The US findings were shared with Ms. Severjanen and her
 husband.
Recommendations

 Fetal ECHO scheduled
 Follow-up as clinically indicated

 questions or concerns.

## 2014-05-27 LAB — OB RESULTS CONSOLE GBS: GBS: NEGATIVE

## 2014-06-16 ENCOUNTER — Encounter (HOSPITAL_COMMUNITY): Payer: Self-pay | Admitting: *Deleted

## 2014-06-16 ENCOUNTER — Telehealth (HOSPITAL_COMMUNITY): Payer: Self-pay | Admitting: *Deleted

## 2014-06-16 NOTE — Telephone Encounter (Signed)
Preadmission screen  

## 2014-06-17 ENCOUNTER — Inpatient Hospital Stay (HOSPITAL_COMMUNITY)
Admission: AD | Admit: 2014-06-17 | Discharge: 2014-06-18 | DRG: 775 | Disposition: A | Discharge status: Home / Self Care | Payer: BC Managed Care – PPO | Source: Ambulatory Visit | Attending: Obstetrics and Gynecology | Admitting: Obstetrics and Gynecology

## 2014-06-17 ENCOUNTER — Encounter (HOSPITAL_COMMUNITY): Payer: BC Managed Care – PPO | Admitting: Anesthesiology

## 2014-06-17 ENCOUNTER — Encounter (HOSPITAL_COMMUNITY): Payer: Self-pay | Admitting: *Deleted

## 2014-06-17 ENCOUNTER — Inpatient Hospital Stay (HOSPITAL_COMMUNITY): Payer: BC Managed Care – PPO | Admitting: Anesthesiology

## 2014-06-17 DIAGNOSIS — IMO0001 Reserved for inherently not codable concepts without codable children: Secondary | ICD-10-CM

## 2014-06-17 DIAGNOSIS — E039 Hypothyroidism, unspecified: Secondary | ICD-10-CM | POA: Diagnosis present

## 2014-06-17 DIAGNOSIS — F341 Dysthymic disorder: Secondary | ICD-10-CM | POA: Diagnosis present

## 2014-06-17 DIAGNOSIS — O360990 Maternal care for other rhesus isoimmunization, unspecified trimester, not applicable or unspecified: Secondary | ICD-10-CM | POA: Diagnosis present

## 2014-06-17 DIAGNOSIS — E079 Disorder of thyroid, unspecified: Secondary | ICD-10-CM | POA: Diagnosis present

## 2014-06-17 DIAGNOSIS — O99344 Other mental disorders complicating childbirth: Secondary | ICD-10-CM | POA: Diagnosis present

## 2014-06-17 DIAGNOSIS — O99284 Endocrine, nutritional and metabolic diseases complicating childbirth: Secondary | ICD-10-CM

## 2014-06-17 DIAGNOSIS — O99891 Other specified diseases and conditions complicating pregnancy: Secondary | ICD-10-CM | POA: Diagnosis present

## 2014-06-17 LAB — CBC
HCT: 38.1 % (ref 36.0–46.0)
HCT: 36.3 % (ref 36.0–46.0)
Hemoglobin: 12.9 g/dL (ref 12.0–15.0)
Hemoglobin: 12.3 g/dL (ref 12.0–15.0)
MCH: 29.8 pg (ref 26.0–34.0)
MCH: 29.9 pg (ref 26.0–34.0)
MCHC: 33.9 g/dL (ref 30.0–36.0)
MCHC: 33.9 g/dL (ref 30.0–36.0)
MCV: 88 fL (ref 78.0–100.0)
MCV: 88.1 fL (ref 78.0–100.0)
Platelets: 158 10*3/uL (ref 150–400)
Platelets: 156 10*3/uL (ref 150–400)
RBC: 4.33 MIL/uL (ref 3.87–5.11)
RBC: 4.12 MIL/uL (ref 3.87–5.11)
RDW: 13.8 % (ref 11.5–15.5)
RDW: 13.8 % (ref 11.5–15.5)
WBC: 14.1 10*3/uL — ABNORMAL HIGH (ref 4.0–10.5)
WBC: 20.3 10*3/uL — ABNORMAL HIGH (ref 4.0–10.5)

## 2014-06-17 LAB — RPR

## 2014-06-17 MED ORDER — DIPHENHYDRAMINE HCL 50 MG/ML IJ SOLN: 12.5000 mg | INTRAMUSCULAR | Status: DC | PRN

## 2014-06-17 MED ORDER — CITRIC ACID-SODIUM CITRATE 334-500 MG/5ML PO SOLN: 30.0000 mL | ORAL | Status: DC | PRN

## 2014-06-17 MED ORDER — FENTANYL 2.5 MCG/ML BUPIVACAINE 1/10 % EPIDURAL INFUSION (WH - ANES)
14.0000 mL/h | INTRAMUSCULAR | Status: DC | PRN
  Administered 2014-06-17: 14 mL/h via EPIDURAL

## 2014-06-17 MED ORDER — LACTATED RINGERS IV SOLN
INTRAVENOUS | Status: DC
  Administered 2014-06-17: 12:00:00 via INTRAVENOUS

## 2014-06-17 MED ORDER — ACETAMINOPHEN 325 MG PO TABS: 650.0000 mg | ORAL_TABLET | ORAL | Status: DC | PRN

## 2014-06-17 MED ORDER — BENZOCAINE-MENTHOL 20-0.5 % EX AERO: 1.0000 "application " | INHALATION_SPRAY | CUTANEOUS | Status: DC | PRN

## 2014-06-17 MED ORDER — PHENYLEPHRINE 40 MCG/ML (10ML) SYRINGE FOR IV PUSH (FOR BLOOD PRESSURE SUPPORT)
80.0000 ug | PREFILLED_SYRINGE | INTRAVENOUS | Status: DC | PRN
Start: 2014-06-17 — End: 2014-06-17
  Filled 2014-06-17: qty 2

## 2014-06-17 MED ORDER — ONDANSETRON HCL 4 MG PO TABS: 4.0000 mg | ORAL_TABLET | ORAL | Status: DC | PRN

## 2014-06-17 MED ORDER — OXYTOCIN 40 UNITS IN LACTATED RINGERS INFUSION - SIMPLE MED: 62.5000 mL/h | INTRAVENOUS | Status: DC

## 2014-06-17 MED ORDER — OXYTOCIN 40 UNITS IN LACTATED RINGERS INFUSION - SIMPLE MED
INTRAVENOUS | Status: AC
  Filled 2014-06-17: qty 1000

## 2014-06-17 MED ORDER — SENNOSIDES-DOCUSATE SODIUM 8.6-50 MG PO TABS
2.0000 | ORAL_TABLET | ORAL | Status: DC
  Filled 2014-06-17: qty 2

## 2014-06-17 MED ORDER — LACTATED RINGERS IV SOLN: 500.0000 mL | INTRAVENOUS | Status: DC | PRN

## 2014-06-17 MED ORDER — LANOLIN HYDROUS EX OINT: TOPICAL_OINTMENT | CUTANEOUS | Status: DC | PRN

## 2014-06-17 MED ORDER — ZOLPIDEM TARTRATE 5 MG PO TABS: 5.0000 mg | ORAL_TABLET | Freq: Every evening | ORAL | Status: DC | PRN

## 2014-06-17 MED ORDER — OXYCODONE-ACETAMINOPHEN 5-325 MG PO TABS: 1.0000 | ORAL_TABLET | ORAL | Status: DC | PRN

## 2014-06-17 MED ORDER — LIDOCAINE HCL (PF) 1 % IJ SOLN
INTRAMUSCULAR | Status: DC | PRN
  Administered 2014-06-17 (×2): 4 mL

## 2014-06-17 MED ORDER — IBUPROFEN 600 MG PO TABS: 600.0000 mg | ORAL_TABLET | Freq: Four times a day (QID) | ORAL | Status: DC | PRN

## 2014-06-17 MED ORDER — PHENYLEPHRINE 40 MCG/ML (10ML) SYRINGE FOR IV PUSH (FOR BLOOD PRESSURE SUPPORT)
PREFILLED_SYRINGE | INTRAVENOUS | Status: AC
  Filled 2014-06-17: qty 5

## 2014-06-17 MED ORDER — EPHEDRINE 5 MG/ML INJ
10.0000 mg | INTRAVENOUS | Status: DC | PRN
Start: 2014-06-17 — End: 2014-06-17
  Filled 2014-06-17: qty 2

## 2014-06-17 MED ORDER — BISACODYL 10 MG RE SUPP: 10.0000 mg | Freq: Every day | RECTAL | Status: DC | PRN

## 2014-06-17 MED ORDER — PHENYLEPHRINE 40 MCG/ML (10ML) SYRINGE FOR IV PUSH (FOR BLOOD PRESSURE SUPPORT)
80.0000 ug | PREFILLED_SYRINGE | INTRAVENOUS | Status: DC | PRN
  Filled 2014-06-17: qty 2

## 2014-06-17 MED ORDER — DIPHENHYDRAMINE HCL 25 MG PO CAPS: 25.0000 mg | ORAL_CAPSULE | Freq: Four times a day (QID) | ORAL | Status: DC | PRN

## 2014-06-17 MED ORDER — MEASLES, MUMPS & RUBELLA VAC ~~LOC~~ INJ: 0.5000 mL | INJECTION | Freq: Once | SUBCUTANEOUS | Status: DC

## 2014-06-17 MED ORDER — FLEET ENEMA 7-19 GM/118ML RE ENEM: 1.0000 | ENEMA | Freq: Every day | RECTAL | Status: DC | PRN

## 2014-06-17 MED ORDER — SIMETHICONE 80 MG PO CHEW: 80.0000 mg | CHEWABLE_TABLET | ORAL | Status: DC | PRN

## 2014-06-17 MED ORDER — LIDOCAINE HCL (PF) 1 % IJ SOLN
30.0000 mL | INTRAMUSCULAR | Status: DC | PRN
  Administered 2014-06-17: 30 mL via SUBCUTANEOUS
  Filled 2014-06-17: qty 30

## 2014-06-17 MED ORDER — FENTANYL 2.5 MCG/ML BUPIVACAINE 1/10 % EPIDURAL INFUSION (WH - ANES)
INTRAMUSCULAR | Status: AC
  Administered 2014-06-17: 14 mL/h via EPIDURAL
  Filled 2014-06-17: qty 125

## 2014-06-17 MED ORDER — WITCH HAZEL-GLYCERIN EX PADS: 1.0000 "application " | MEDICATED_PAD | CUTANEOUS | Status: DC | PRN

## 2014-06-17 MED ORDER — OXYTOCIN BOLUS FROM INFUSION
500.0000 mL | INTRAVENOUS | Status: DC
  Administered 2014-06-17: 500 mL via INTRAVENOUS

## 2014-06-17 MED ORDER — LACTATED RINGERS IV SOLN: 500.0000 mL | Freq: Once | INTRAVENOUS | Status: DC

## 2014-06-17 MED ORDER — IBUPROFEN 800 MG PO TABS
800.0000 mg | ORAL_TABLET | Freq: Three times a day (TID) | ORAL | Status: DC | PRN
  Administered 2014-06-18 (×2): 800 mg via ORAL
  Filled 2014-06-17 (×3): qty 1

## 2014-06-17 MED ORDER — LIDOCAINE HCL (PF) 1 % IJ SOLN
INTRAMUSCULAR | Status: AC
  Filled 2014-06-17: qty 30

## 2014-06-17 MED ORDER — OXYCODONE-ACETAMINOPHEN 5-325 MG PO TABS: 1.0000 | ORAL_TABLET | Freq: Four times a day (QID) | ORAL | Status: DC | PRN

## 2014-06-17 MED ORDER — PRENATAL MULTIVITAMIN CH
1.0000 | ORAL_TABLET | Freq: Every day | ORAL | Status: DC
  Administered 2014-06-18: 1 via ORAL
  Filled 2014-06-17: qty 1

## 2014-06-17 MED ORDER — LEVOTHYROXINE SODIUM 100 MCG PO TABS
100.0000 ug | ORAL_TABLET | Freq: Every day | ORAL | Status: DC
  Administered 2014-06-18: 100 ug via ORAL
  Filled 2014-06-17 (×2): qty 1

## 2014-06-17 MED ORDER — ONDANSETRON HCL 4 MG/2ML IJ SOLN: 4.0000 mg | INTRAMUSCULAR | Status: DC | PRN

## 2014-06-17 MED ORDER — RHO D IMMUNE GLOBULIN 1500 UNIT/2ML IJ SOSY
300.0000 ug | PREFILLED_SYRINGE | Freq: Once | INTRAMUSCULAR | Status: DC
  Filled 2014-06-17: qty 2

## 2014-06-17 MED ORDER — FLEET ENEMA 7-19 GM/118ML RE ENEM: 1.0000 | ENEMA | RECTAL | Status: DC | PRN

## 2014-06-17 MED ORDER — ONDANSETRON HCL 4 MG/2ML IJ SOLN
4.0000 mg | Freq: Four times a day (QID) | INTRAMUSCULAR | Status: DC | PRN
Start: 2014-06-17 — End: 2014-06-17

## 2014-06-17 MED ORDER — TETANUS-DIPHTH-ACELL PERTUSSIS 5-2.5-18.5 LF-MCG/0.5 IM SUSP: 0.5000 mL | Freq: Once | INTRAMUSCULAR | Status: DC

## 2014-06-17 MED ORDER — BUTORPHANOL TARTRATE 1 MG/ML IJ SOLN: 1.0000 mg | Freq: Once | INTRAMUSCULAR | Status: DC

## 2014-06-17 MED ORDER — EPHEDRINE 5 MG/ML INJ
10.0000 mg | INTRAVENOUS | Status: DC | PRN
  Filled 2014-06-17: qty 2

## 2014-06-17 MED ORDER — FENTANYL 2.5 MCG/ML BUPIVACAINE 1/10 % EPIDURAL INFUSION (WH - ANES)
INTRAMUSCULAR | Status: DC | PRN
Start: 2014-06-17 — End: 2014-06-18
  Administered 2014-06-17: 14 mL/h via EPIDURAL

## 2014-06-17 MED ORDER — DIBUCAINE 1 % RE OINT: 1.0000 "application " | TOPICAL_OINTMENT | RECTAL | Status: DC | PRN

## 2014-06-17 NOTE — H&P (Signed)
Brooke Galvan  DICTATION # [id number] CSN# [id number]   Meriel PicaHOLLAND,[name], MD 06/17/2014 12:14 PM

## 2014-06-17 NOTE — Anesthesia Preprocedure Evaluation (Signed)
Anesthesia Evaluation  Patient identified by MRN, date of birth, ID band Patient awake    Reviewed: Allergy & Precautions, H&P , Patient's Chart, lab work & pertinent test results  Airway Mallampati: II TM Distance: >3 FB Neck ROM: Full    Dental no notable dental hx. (+) Teeth Intact   Pulmonary asthma , former smoker,  breath sounds clear to auscultation  Pulmonary exam normal       Cardiovascular negative cardio ROS  Rhythm:Regular Rate:Normal     Neuro/Psych PSYCHIATRIC DISORDERS Anxiety Depression    GI/Hepatic negative GI ROS, Neg liver ROS,   Endo/Other  Hypothyroidism   Renal/GU negative Renal ROS  negative genitourinary   Musculoskeletal negative musculoskeletal ROS (+)   Abdominal   Peds  Hematology negative hematology ROS (+)   Anesthesia Other Findings   Reproductive/Obstetrics (+) Pregnancy                           Anesthesia Physical Anesthesia Plan  ASA: II  Anesthesia Plan: Epidural   Post-op Pain Management:    Induction:   Airway Management Planned: Natural Airway  Additional Equipment:   Intra-op Plan:   Post-operative Plan:   Informed Consent: I have reviewed the patients History and Physical, chart, labs and discussed the procedure including the risks, benefits and alternatives for the proposed anesthesia with the patient or authorized representative who has indicated his/her understanding and acceptance.     Plan Discussed with: Anesthesiologist  Anesthesia Plan Comments:         Anesthesia Quick Evaluation

## 2014-06-17 NOTE — Anesthesia Procedure Notes (Signed)
Epidural Patient location during procedure: OB Start time: 06/17/2014 4:26 PM  Staffing Anesthesiologist: Kandis Henry A. Performed by: anesthesiologist   Preanesthetic Checklist Completed: patient identified, site marked, surgical consent, pre-op evaluation, timeout performed, IV checked, risks and benefits discussed and monitors and equipment checked  Epidural Patient position: sitting Prep: site prepped and draped and DuraPrep Patient monitoring: continuous pulse ox and blood pressure Approach: midline Location: L3-L4 Injection technique: LOR air  Needle:  Needle type: Tuohy  Needle gauge: 17 G Needle length: 9 cm and 9 Needle insertion depth: 4 cm Catheter type: closed end flexible Catheter size: 19 Gauge Catheter at skin depth: 9 cm Test dose: negative and Other  Assessment Events: blood not aspirated, injection not painful, no injection resistance, negative IV test and no paresthesia  Additional Notes Patient identified. Risks and benefits discussed including failed block, incomplete  Pain control, post dural puncture headache, nerve damage, paralysis, blood pressure Changes, nausea, vomiting, reactions to medications-both toxic and allergic and post Partum back pain. All questions were answered. Patient expressed understanding and wished to proceed. Sterile technique was used throughout procedure. Epidural site was Dressed with sterile barrier dressing. No paresthesias, signs of intravascular injection Or signs of intrathecal spread were encountered.  Patient was more comfortable after the epidural was dosed. Please see RN's note for documentation of vital signs and FHR which are stable.

## 2014-06-17 NOTE — MAU Note (Signed)
Pt presents to MAU from physician's office for rupture of membranes. Reports VE 5 at the office today. Denies any vaginal bleeding

## 2014-06-17 NOTE — H&P (Signed)
NAMMarylynn Galvan:  Lallier, Lanyiah                 ACCOUNT NO.:  [id number]  MEDICAL RECORD NO.:  [id number]  LOCATION:  [facility]                          FACILITY:  [facility]  PHYSICIAN:  Duke Salviaichard M. Marcelle OverlieHolland, M.D.DATE OF BIRTH:  [birth year]  DATE OF ADMISSION:  06/17/2014 DATE OF DISCHARGE:                             HISTORY & PHYSICAL   CHIEF COMPLAINT:  Rupture of membranes, labor at term.  HISTORY OF PRESENT ILLNESS:  A 23 year old, G3, P1-0-1-1, GBS negative, near term was seen in the office today whose cervix was 5 complete -1. After exam, she had rupture of clear fluid.  She has a history of hypothyroidism, but her thyroid profile has been normal during the pregnancy.  She is also Rh negative.  PAST MEDICAL HISTORY:  Please see the [facility] form for details.  PHYSICAL EXAMINATION:  VITAL SIGNS:  Temp 98.2, blood pressure 120/80. HEENT:  Unremarkable. NECK:  Supple without masses. LUNGS:  Clear. CARDIOVASCULAR:  Regular rate and rhythm without murmurs, rubs, or gallops. BREASTS:  Not examined. PELVIC:  Term fundal height, fetal heart rate 140, cervix was 5 complete vertex, clear fluid. EXTREMITIES:  Unremarkable. NEUROLOGIC:  Unremarkable.  IMPRESSION:  Term pregnancy, labor with spontaneous rupture of membranes.  PLAN:  GBS was negative.  We will admit for labor.     Kadesha Virrueta M. Marcelle OverlieHolland, M.D.     RM[facility]/MEDQ  D:  06/17/2014  T:  06/17/2014  Job:  [id number]

## 2014-06-17 NOTE — Progress Notes (Signed)
comt with epidural, now 9/C/ AROM forebag>>>clear AF, stable FHR

## 2014-06-18 LAB — CBC
HCT: 38.9 % (ref 36.0–46.0)
Hemoglobin: 13.3 g/dL (ref 12.0–15.0)
MCH: 30 pg (ref 26.0–34.0)
MCHC: 34.2 g/dL (ref 30.0–36.0)
MCV: 87.8 fL (ref 78.0–100.0)
Platelets: 167 10*3/uL (ref 150–400)
RBC: 4.43 MIL/uL (ref 3.87–5.11)
RDW: 13.6 % (ref 11.5–15.5)
WBC: 16 10*3/uL — ABNORMAL HIGH (ref 4.0–10.5)

## 2014-06-18 MED ORDER — RHO D IMMUNE GLOBULIN 1500 UNIT/2ML IJ SOSY
300.0000 ug | PREFILLED_SYRINGE | Freq: Once | INTRAMUSCULAR | Status: AC
  Administered 2014-06-18: 300 ug via INTRAMUSCULAR
  Filled 2014-06-18: qty 2

## 2014-06-18 MED ORDER — IBUPROFEN 800 MG PO TABS: 800.0000 mg | ORAL_TABLET | Freq: Three times a day (TID) | ORAL | Status: DC | PRN

## 2014-06-18 NOTE — Progress Notes (Signed)
Post Partum Day 1 Subjective: no complaints, up ad lib, voiding, tolerating PO and desires late discharge  Objective: Blood pressure 117/64, pulse 62, temperature 97.5 F (36.4 C), temperature source Oral, resp. rate 18, height 5\' 3"  (1.6 m), weight 158 lb (71.668 kg), last menstrual period 09/18/2013, SpO2 99.00%, unknown if currently breastfeeding.  Physical Exam:  General: alert and cooperative Lochia: appropriate Uterine Fundus: firm Incision: perineum intact DVT Evaluation: No evidence of DVT seen on physical exam. Negative Homan's sign. No cords or calf tenderness. No significant calf/ankle edema.   Recent Labs  06/17/14 1812 06/18/14 0520  HGB 12.3 13.3  HCT 36.3 38.9    Assessment/Plan: Discharge home   LOS: 1 day   CURTIS,CAROL G 06/18/2014, 7:46 AM

## 2014-06-18 NOTE — Discharge Summary (Signed)
Obstetric Discharge Summary Reason for Admission: onset of labor Prenatal Procedures: ultrasound Intrapartum Procedures: spontaneous vaginal delivery Postpartum Procedures: none Complications-Operative and Postpartum: periurethral laceration Hemoglobin  Date Value Ref Range Status  06/18/2014 13.3  12.0 - 15.0 g/dL Final     HCT  Date Value Ref Range Status  06/18/2014 38.9  36.0 - 46.0 % Final    Physical Exam:  General: alert and cooperative Lochia: appropriate Uterine Fundus: firm Incision: perineum intact DVT Evaluation: no evidence of DVT, no significant pedal edema  Discharge Diagnoses: Term Pregnancy-delivered  Discharge Information: Date: 06/18/2014 Activity: pelvic rest Diet: routine Medications: PNV, Ibuprofen and synthroid Condition: stable Instructions: refer to practice specific booklet Discharge to: home   Newborn Data: Live born female  Birth Weight: 9 lb 4.7 oz (4215 g) APGAR: 9, 9  Home with mother.  CURTIS,CAROL G 06/18/2014, 7:52 AM

## 2014-06-18 NOTE — Anesthesia Postprocedure Evaluation (Signed)
  Anesthesia Post-op Note  Patient: Brooke BrazilKelly R Galvan  Procedure(s) Performed: * No procedures listed *  Patient Location: Mother/Baby  Anesthesia Type:Epidural  Level of Consciousness: awake  Airway and Oxygen Therapy: Patient Spontanous Breathing  Post-op Pain: none  Post-op Assessment: Post-op Vital signs reviewed, Patient's Cardiovascular Status Stable, Respiratory Function Stable, Patent Airway, No signs of Nausea or vomiting, Adequate PO intake, Pain level controlled, No headache, No backache, No residual numbness and No residual motor weakness  Post-op Vital Signs: Reviewed and stable  Last Vitals:  Filed Vitals:   06/18/14 0015  BP: 117/64  Pulse: 62  Temp: 36.4 C  Resp: 18    Complications: No apparent anesthesia complications

## 2014-06-18 NOTE — Lactation Note (Signed)
This note was copied from the chart of Brooke Nance PearKelly Poet. Lactation Consultation Note     Follow up consult with this mom and baby, now 17 hours ol, and term. Mom was breast feeding in  Cradle hold, sitting forward, with baby turned away from her, in her lap. I showed mom how to support her back, unwrap the baby, turn her in belly to belly, and hold her across from on breast to the other. Mom reports some sore nipples. She also reports her nipples being much more comfortable in this position. She was thankful for the advise.  I also showed mom how much deeper her baby was now latched, which will protect her nipples, nd provide a better transfer to her baby. Mom is going home early today. She is an experienced breast feeder, and knows she can call lactation once home, for any questions/concenrs.    Patient Name: Brooke Galvan ZOXWR'UToday's Date: 06/18/2014 Reason for consult: Follow-up assessment   Maternal Data    Feeding Feeding Type: Breast Fed  LATCH Score/Interventions Latch: Grasps breast easily, tongue down, lips flanged, rhythmical sucking.  Audible Swallowing: A few with stimulation  Type of Nipple: Everted at rest and after stimulation  Comfort (Breast/Nipple): Filling, red/small blisters or bruises, mild/mod discomfort  Problem noted: Mild/Moderate discomfort  Hold (Positioning): Assistance needed to correctly position infant at breast and maintain latch. Intervention(s): Breastfeeding basics reviewed;Support Pillows;Position options;Skin to skin  LATCH Score: 7  Lactation Tools Discussed/Used     Consult Status Consult Status: Complete Follow-up type: Call as needed    Alfred LevinsLee, Orley Lawry Anne 06/18/2014, 11:04 AM

## 2014-06-19 LAB — RH IG WORKUP (INCLUDES ABO/RH)
ABO/RH(D): A NEG
Antibody Screen: POSITIVE
DAT, IgG: NEGATIVE
Fetal Screen: NEGATIVE
Gestational Age(Wks): 38
Unit division: 0

## 2014-06-22 ENCOUNTER — Inpatient Hospital Stay (HOSPITAL_COMMUNITY): Admission: RE | Admit: 2014-06-22 | Payer: BC Managed Care – PPO | Source: Ambulatory Visit

## 2014-08-31 ENCOUNTER — Encounter (HOSPITAL_COMMUNITY): Payer: Self-pay | Admitting: *Deleted

## 2016-07-18 LAB — OB RESULTS CONSOLE RUBELLA ANTIBODY, IGM: Rubella: IMMUNE

## 2016-07-18 LAB — OB RESULTS CONSOLE RPR: RPR: NONREACTIVE

## 2016-07-18 LAB — OB RESULTS CONSOLE HIV ANTIBODY (ROUTINE TESTING): HIV: NONREACTIVE

## 2016-07-18 LAB — OB RESULTS CONSOLE GC/CHLAMYDIA
Chlamydia: NEGATIVE
Gonorrhea: NEGATIVE

## 2016-07-18 LAB — OB RESULTS CONSOLE ABO/RH: RH Type: NEGATIVE

## 2016-07-18 LAB — OB RESULTS CONSOLE HEPATITIS B SURFACE ANTIGEN: Hepatitis B Surface Ag: NEGATIVE

## 2016-07-18 LAB — OB RESULTS CONSOLE ANTIBODY SCREEN: Antibody Screen: NEGATIVE

## 2016-10-30 NOTE — L&D Delivery Note (Signed)
Delivery Note  SVD viable female Apgars 8,9 over intact perineum.  Placenta delivered spontaneously intact with 3VC.Good support and hemostasis noted and R/V exam confirms.  PH art was sent.  [place] cord blood was not done.  Mother and baby were doing well.  EBL 100cc  Candice Camp, MD

## 2017-01-09 LAB — OB RESULTS CONSOLE GBS: GBS: NEGATIVE

## 2017-01-29 ENCOUNTER — Telehealth (HOSPITAL_COMMUNITY): Payer: Self-pay | Admitting: *Deleted

## 2017-01-29 ENCOUNTER — Encounter (HOSPITAL_COMMUNITY): Payer: Self-pay | Admitting: *Deleted

## 2017-01-29 NOTE — Telephone Encounter (Signed)
Preadmission screen  

## 2017-02-05 ENCOUNTER — Inpatient Hospital Stay (HOSPITAL_COMMUNITY)
Admission: RE | Admit: 2017-02-05 | Discharge: 2017-02-06 | DRG: 775 | Disposition: A | Discharge status: Home / Self Care | Payer: BLUE CROSS/BLUE SHIELD | Source: Ambulatory Visit | Attending: Obstetrics and Gynecology | Admitting: Obstetrics and Gynecology

## 2017-02-05 ENCOUNTER — Encounter (HOSPITAL_COMMUNITY): Payer: Self-pay

## 2017-02-05 ENCOUNTER — Inpatient Hospital Stay (HOSPITAL_COMMUNITY): Payer: BLUE CROSS/BLUE SHIELD | Admitting: Anesthesiology

## 2017-02-05 DIAGNOSIS — Z87891 Personal history of nicotine dependence: Secondary | ICD-10-CM | POA: Diagnosis not present

## 2017-02-05 DIAGNOSIS — Z3A40 40 weeks gestation of pregnancy: Secondary | ICD-10-CM | POA: Diagnosis not present

## 2017-02-05 DIAGNOSIS — Z3493 Encounter for supervision of normal pregnancy, unspecified, third trimester: Secondary | ICD-10-CM | POA: Diagnosis present

## 2017-02-05 DIAGNOSIS — E039 Hypothyroidism, unspecified: Secondary | ICD-10-CM | POA: Diagnosis present

## 2017-02-05 DIAGNOSIS — Z8249 Family history of ischemic heart disease and other diseases of the circulatory system: Secondary | ICD-10-CM

## 2017-02-05 DIAGNOSIS — O99284 Endocrine, nutritional and metabolic diseases complicating childbirth: Secondary | ICD-10-CM | POA: Diagnosis present

## 2017-02-05 DIAGNOSIS — Z3490 Encounter for supervision of normal pregnancy, unspecified, unspecified trimester: Secondary | ICD-10-CM

## 2017-02-05 LAB — CBC
HCT: 38.1 % (ref 36.0–46.0)
Hemoglobin: 12.5 g/dL (ref 12.0–15.0)
MCH: 26.8 pg (ref 26.0–34.0)
MCHC: 32.8 g/dL (ref 30.0–36.0)
MCV: 81.8 fL (ref 78.0–100.0)
Platelets: 242 10*3/uL (ref 150–400)
RBC: 4.66 MIL/uL (ref 3.87–5.11)
RDW: 13.7 % (ref 11.5–15.5)
WBC: 10.7 10*3/uL — ABNORMAL HIGH (ref 4.0–10.5)

## 2017-02-05 MED ORDER — MEDROXYPROGESTERONE ACETATE 150 MG/ML IM SUSP: 150.0000 mg | INTRAMUSCULAR | Status: DC | PRN

## 2017-02-05 MED ORDER — ONDANSETRON HCL 4 MG/2ML IJ SOLN: 4.0000 mg | Freq: Four times a day (QID) | INTRAMUSCULAR | Status: DC | PRN

## 2017-02-05 MED ORDER — SENNOSIDES-DOCUSATE SODIUM 8.6-50 MG PO TABS
2.0000 | ORAL_TABLET | ORAL | Status: DC
  Administered 2017-02-05: 2 via ORAL
  Filled 2017-02-05: qty 2

## 2017-02-05 MED ORDER — OXYTOCIN BOLUS FROM INFUSION: 500.0000 mL | Freq: Once | INTRAVENOUS | Status: DC

## 2017-02-05 MED ORDER — TERBUTALINE SULFATE 1 MG/ML IJ SOLN: 0.2500 mg | Freq: Once | INTRAMUSCULAR | Status: DC | PRN

## 2017-02-05 MED ORDER — ZOLPIDEM TARTRATE 5 MG PO TABS: 5.0000 mg | ORAL_TABLET | Freq: Every evening | ORAL | Status: DC | PRN

## 2017-02-05 MED ORDER — DIPHENHYDRAMINE HCL 25 MG PO CAPS: 25.0000 mg | ORAL_CAPSULE | Freq: Four times a day (QID) | ORAL | Status: DC | PRN

## 2017-02-05 MED ORDER — TETANUS-DIPHTH-ACELL PERTUSSIS 5-2.5-18.5 LF-MCG/0.5 IM SUSP: 0.5000 mL | Freq: Once | INTRAMUSCULAR | Status: DC

## 2017-02-05 MED ORDER — SIMETHICONE 80 MG PO CHEW: 80.0000 mg | CHEWABLE_TABLET | ORAL | Status: DC | PRN

## 2017-02-05 MED ORDER — ONDANSETRON HCL 4 MG PO TABS: 4.0000 mg | ORAL_TABLET | ORAL | Status: DC | PRN

## 2017-02-05 MED ORDER — IBUPROFEN 600 MG PO TABS
600.0000 mg | ORAL_TABLET | Freq: Four times a day (QID) | ORAL | Status: DC
  Administered 2017-02-05 – 2017-02-06 (×4): 600 mg via ORAL
  Filled 2017-02-05 (×4): qty 1

## 2017-02-05 MED ORDER — PHENYLEPHRINE 40 MCG/ML (10ML) SYRINGE FOR IV PUSH (FOR BLOOD PRESSURE SUPPORT)
80.0000 ug | PREFILLED_SYRINGE | INTRAVENOUS | Status: DC | PRN
Start: 2017-02-05 — End: 2017-02-05
  Filled 2017-02-05: qty 10
  Filled 2017-02-05: qty 5

## 2017-02-05 MED ORDER — DIBUCAINE 1 % RE OINT
1.0000 | TOPICAL_OINTMENT | RECTAL | Status: DC | PRN
Start: 2017-02-05 — End: 2017-02-06

## 2017-02-05 MED ORDER — LACTATED RINGERS IV SOLN: 500.0000 mL | INTRAVENOUS | Status: DC | PRN

## 2017-02-05 MED ORDER — EPHEDRINE 5 MG/ML INJ
10.0000 mg | INTRAVENOUS | Status: DC | PRN
  Filled 2017-02-05: qty 2

## 2017-02-05 MED ORDER — SOD CITRATE-CITRIC ACID 500-334 MG/5ML PO SOLN
30.0000 mL | ORAL | Status: DC | PRN
Start: 2017-02-05 — End: 2017-02-05

## 2017-02-05 MED ORDER — EPHEDRINE 5 MG/ML INJ: 10.0000 mg | INTRAVENOUS | Status: DC | PRN

## 2017-02-05 MED ORDER — PHENYLEPHRINE 40 MCG/ML (10ML) SYRINGE FOR IV PUSH (FOR BLOOD PRESSURE SUPPORT): 80.0000 ug | PREFILLED_SYRINGE | INTRAVENOUS | Status: DC | PRN

## 2017-02-05 MED ORDER — OXYTOCIN 40 UNITS IN LACTATED RINGERS INFUSION - SIMPLE MED
1.0000 m[IU]/min | INTRAVENOUS | Status: DC
  Administered 2017-02-05: 4 m[IU]/min via INTRAVENOUS
  Administered 2017-02-05: 2 m[IU]/min via INTRAVENOUS

## 2017-02-05 MED ORDER — OXYCODONE-ACETAMINOPHEN 5-325 MG PO TABS: 2.0000 | ORAL_TABLET | ORAL | Status: DC | PRN

## 2017-02-05 MED ORDER — LIDOCAINE HCL (PF) 1 % IJ SOLN
30.0000 mL | INTRAMUSCULAR | Status: DC | PRN
  Filled 2017-02-05: qty 30

## 2017-02-05 MED ORDER — LEVOTHYROXINE SODIUM 125 MCG PO TABS
125.0000 ug | ORAL_TABLET | Freq: Every day | ORAL | Status: DC
Start: 2017-02-06 — End: 2017-02-06
  Administered 2017-02-06: 125 ug via ORAL
  Filled 2017-02-05 (×2): qty 1

## 2017-02-05 MED ORDER — OXYTOCIN 40 UNITS IN LACTATED RINGERS INFUSION - SIMPLE MED
2.5000 [IU]/h | INTRAVENOUS | Status: DC
  Administered 2017-02-05: 2.5 [IU]/h via INTRAVENOUS
  Filled 2017-02-05: qty 1000

## 2017-02-05 MED ORDER — OXYCODONE-ACETAMINOPHEN 5-325 MG PO TABS: 1.0000 | ORAL_TABLET | ORAL | Status: DC | PRN

## 2017-02-05 MED ORDER — DIPHENHYDRAMINE HCL 50 MG/ML IJ SOLN: 12.5000 mg | INTRAMUSCULAR | Status: DC | PRN

## 2017-02-05 MED ORDER — LACTATED RINGERS IV SOLN
500.0000 mL | Freq: Once | INTRAVENOUS | Status: AC
  Administered 2017-02-05: 500 mL via INTRAVENOUS

## 2017-02-05 MED ORDER — LIDOCAINE HCL (PF) 1 % IJ SOLN
INTRAMUSCULAR | Status: DC | PRN
  Administered 2017-02-05: 2 mL
  Administered 2017-02-05: 5 mL
  Administered 2017-02-05: 3 mL

## 2017-02-05 MED ORDER — COCONUT OIL OIL: 1.0000 "application " | TOPICAL_OIL | Status: DC | PRN

## 2017-02-05 MED ORDER — LACTATED RINGERS IV SOLN: 500.0000 mL | Freq: Once | INTRAVENOUS | Status: DC

## 2017-02-05 MED ORDER — WITCH HAZEL-GLYCERIN EX PADS: 1.0000 "application " | MEDICATED_PAD | CUTANEOUS | Status: DC | PRN

## 2017-02-05 MED ORDER — PHENYLEPHRINE 40 MCG/ML (10ML) SYRINGE FOR IV PUSH (FOR BLOOD PRESSURE SUPPORT)
80.0000 ug | PREFILLED_SYRINGE | INTRAVENOUS | Status: DC | PRN
  Filled 2017-02-05: qty 5
  Filled 2017-02-05: qty 10

## 2017-02-05 MED ORDER — LACTATED RINGERS IV SOLN
INTRAVENOUS | Status: DC
  Administered 2017-02-05: 08:00:00 via INTRAVENOUS

## 2017-02-05 MED ORDER — FENTANYL 2.5 MCG/ML BUPIVACAINE 1/10 % EPIDURAL INFUSION (WH - ANES)
14.0000 mL/h | INTRAMUSCULAR | Status: DC | PRN
  Administered 2017-02-05: 14 mL/h via EPIDURAL
  Filled 2017-02-05: qty 100

## 2017-02-05 MED ORDER — EPHEDRINE 5 MG/ML INJ
10.0000 mg | INTRAVENOUS | Status: DC | PRN
Start: 2017-02-05 — End: 2017-02-05
  Filled 2017-02-05: qty 2

## 2017-02-05 MED ORDER — MEASLES, MUMPS & RUBELLA VAC ~~LOC~~ INJ
0.5000 mL | INJECTION | Freq: Once | SUBCUTANEOUS | Status: DC
  Filled 2017-02-05: qty 0.5

## 2017-02-05 MED ORDER — ACETAMINOPHEN 325 MG PO TABS: 650.0000 mg | ORAL_TABLET | ORAL | Status: DC | PRN

## 2017-02-05 MED ORDER — PRENATAL MULTIVITAMIN CH
1.0000 | ORAL_TABLET | Freq: Every day | ORAL | Status: DC
  Administered 2017-02-06: 1 via ORAL
  Filled 2017-02-05: qty 1

## 2017-02-05 MED ORDER — ONDANSETRON HCL 4 MG/2ML IJ SOLN: 4.0000 mg | INTRAMUSCULAR | Status: DC | PRN

## 2017-02-05 MED ORDER — BENZOCAINE-MENTHOL 20-0.5 % EX AERO: 1.0000 "application " | INHALATION_SPRAY | CUTANEOUS | Status: DC | PRN

## 2017-02-05 MED ORDER — ACETAMINOPHEN 325 MG PO TABS
650.0000 mg | ORAL_TABLET | ORAL | Status: DC | PRN
  Administered 2017-02-05: 650 mg via ORAL
  Filled 2017-02-05: qty 2

## 2017-02-05 MED ORDER — TERBUTALINE SULFATE 1 MG/ML IJ SOLN
0.2500 mg | Freq: Once | INTRAMUSCULAR | Status: DC | PRN
  Filled 2017-02-05: qty 1

## 2017-02-05 NOTE — H&P (Signed)
Brooke Galvan is a 26 y.o. female presenting for IOL due to patient request.  Pregnancy uncomplciated.  Hx of fetal macrosomia prev pregancies, both SVD.  Normal NIPT.  Neg GBS. OB History    Gravida Para Term Preterm AB Living   0 1 2   SAB TAB Ectopic Multiple Live Births   0 1 0 0 2     Past Medical History:  Diagnosis Date  . Anxiety   . Asthma    sports induced no meds now  . Depression    mild pp dep  . Headache(784.0)   . Hypothyroidism    Past Surgical History:  Procedure Laterality Date  . BREAST SURGERY  2007   aug  . WISDOM TOOTH EXTRACTION     Family History: family history includes Birth defects in her brother; Heart disease in her maternal grandmother; Hypertension in her maternal grandfather. Social History:  reports that she quit smoking about 6 years ago. She has never used smokeless tobacco. She reports that she does not drink alcohol or use drugs.     Maternal Diabetes: No Genetic Screening: Normal Maternal Ultrasounds/Referrals: Normal Fetal Ultrasounds or other Referrals:  None Maternal Substance Abuse:  No Significant Maternal Medications:  None Significant Maternal Lab Results:  None Other Comments:  None  ROS History Dilation: 4 Effacement (%): 70 Station: -2 Exam by:: Dr. Rana Snare  Blood pressure 99/70, pulse 90, temperature 97.3 F (36.3 C), temperature source Oral, resp. rate 16, height  (1.626 m), weight 171 lb (77.6 kg), last menstrual period 05/06/2016, unknown if currently breastfeeding. Exam Physical Exam  Prenatal labs: ABO, Rh: A/Negative/-- (09/19 0000) Antibody: Negative (09/19 0000) Rubella: Immune (09/19 0000) RPR: Nonreactive (09/19 0000)  HBsAg: Negative (09/19 0000)  HIV: Non-reactive (09/19 0000)  GBS: Negative (03/13 0000)   Assessment/Plan: IUP at 40 weeks for IOL AROM, pos Pitocin Anticipate SVD   Dani Danis C 02/05/2017, 9:06 AM

## 2017-02-05 NOTE — Anesthesia Postprocedure Evaluation (Signed)
Anesthesia Post Note  Patient: Brooke Galvan  Procedure(s) Performed: * No procedures listed *  Patient location during evaluation: Mother Baby Anesthesia Type: Epidural Level of consciousness: awake and alert Pain management: satisfactory to patient Vital Signs Assessment: post-procedure vital signs reviewed and stable Respiratory status: respiratory function stable Cardiovascular status: stable Postop Assessment: no headache, no backache, epidural receding, patient able to bend at knees, no signs of nausea or vomiting and adequate PO intake Anesthetic complications: no        Last Vitals:  Vitals:   02/05/17 1520 02/05/17 1640  BP: 120/66 121/65  Pulse: 74 76  Resp: 18 18  Temp: 36.9 C 36.8 C    Last Pain:  Vitals:   02/05/17 1733  TempSrc:   PainSc: 3    Pain Goal:                 Aurore Redinger

## 2017-02-05 NOTE — Plan of Care (Signed)
Problem: Activity: Goal: Ability to tolerate increased activity will improve Outcome: Completed/Met Date Met: 02/05/17 Pt ambulating well on her own.  Legs feeling normal post epidural.  Problem: Urinary Elimination: Goal: Ability to reestablish a normal urinary elimination pattern will improve Outcome: Completed/Met Date Met: 02/05/17 Pt voiding well after delivery.

## 2017-02-05 NOTE — Anesthesia Pain Management Evaluation Note (Signed)
  CRNA Pain Management Visit Note  Patient: Brooke Galvan, 26 y.o., female  "Hello I am a member of the anesthesia team at [facility]. We have an anesthesia team available at all times to provide care throughout the hospital, including epidural management and anesthesia for C-section. I don't know your plan for the delivery whether it a natural birth, water birth, IV sedation, nitrous supplementation, doula or epidural, but we want to meet your pain goals."   1.Was your pain managed to your expectations on prior hospitalizations?   Yes   2.What is your expectation for pain management during this hospitalization?     Epidural  3.How can we help you reach that goal? epidural  Record the patient's initial score and the patient's pain goal.   Pain: 0  Pain Goal: 4 [facility] wants you to be able to say your pain was always managed very well.  Anikka Marsan 02/05/2017

## 2017-02-05 NOTE — Anesthesia Preprocedure Evaluation (Signed)
Anesthesia Evaluation  Patient identified by MRN, date of birth, ID band Patient awake    Reviewed: Allergy & Precautions, Patient's Chart, lab work & pertinent test results  Airway Mallampati: II       Dental   Pulmonary asthma , former smoker,    Pulmonary exam normal        Cardiovascular negative cardio ROS Normal cardiovascular exam     Neuro/Psych    GI/Hepatic negative GI ROS,   Endo/Other  Hypothyroidism   Renal/GU negative Renal ROS     Musculoskeletal   Abdominal   Peds  Hematology negative hematology ROS (+)   Anesthesia Other Findings   Reproductive/Obstetrics (+) Pregnancy                             Lab Results  Component Value Date   WBC 10.7 (H) 02/05/2017   HGB 12.5 02/05/2017   HCT 38.1 02/05/2017   MCV 81.8 02/05/2017   PLT 242 02/05/2017    Anesthesia Physical Anesthesia Plan  ASA: II  Anesthesia Plan: Epidural   Post-op Pain Management:    Induction:   Airway Management Planned: Natural Airway  Additional Equipment:   Intra-op Plan:   Post-operative Plan:   Informed Consent: I have reviewed the patients History and Physical, chart, labs and discussed the procedure including the risks, benefits and alternatives for the proposed anesthesia with the patient or authorized representative who has indicated his/her understanding and acceptance.     Plan Discussed with:   Anesthesia Plan Comments:         Anesthesia Quick Evaluation

## 2017-02-05 NOTE — Progress Notes (Signed)
Dr. Rana Snare called for admission &  IOL orders, no answer at this time, RN left message

## 2017-02-05 NOTE — Lactation Note (Signed)
This note was copied from a baby's chart. Lactation Consultation Note  Patient Name: Brooke Galvan [id number] Date: 02/05/2017 Reason for consult: Initial assessment  Initial visit at 6 hours of age.  Mom reports baby is feeding well.  Mom has history of breast augmentation prior to breast feeding 2 older children with good supply.   Baby already latched, mom reports rolling out lips to get a better latch.  LC observed mom holding baby in cradle hold with narrow gape and shallow latch.  Mom denies pain.  LC encouraged mom to hold baby closer with cheeks touching breast.  Mom unaware of how to hand express.  LC instructed mom on right breast where baby is latched, audible swallows with expression.  LC instructed on alternate breast with colostrum easily expressed.  LC instructed mom on cross cradle hold as it may help with wider gape and deeper latch.  Mom receptive to position change and does well.  LC provided pillow support.   Baby continues feeding mom aware to hand express prior to feeding and during to help keep baby active during feedings. [facility] LC resources given and discussed.  Encouraged to feed with early cues on demand.  Early newborn behavior discussed.  Mom to call for assist as needed.     Maternal Data Has patient been taught Hand Expression?: Yes Does the patient have breastfeeding experience prior to this delivery?: Yes  Feeding Feeding Type: Breast Fed Length of feed:  (several minutes with audible swallows)  LATCH Score/Interventions Latch: Grasps breast easily, tongue down, lips flanged, rhythmical sucking.  Audible Swallowing: A few with stimulation Intervention(s): Skin to skin;Hand expression;Alternate breast massage  Type of Nipple: Everted at rest and after stimulation  Comfort (Breast/Nipple): Soft / non-tender     Hold (Positioning): Assistance needed to correctly position infant at breast and maintain latch. Intervention(s): Breastfeeding basics  reviewed;Support Pillows;Position options;Skin to skin  LATCH Score: 8  Lactation Tools Discussed/Used     Consult Status Consult Status: Follow-up Date: 02/06/17 Follow-up type: In-patient    Shoptaw, Arvella Merles 02/05/2017, 8:19 PM

## 2017-02-05 NOTE — Anesthesia Procedure Notes (Signed)
Epidural Patient location during procedure: OB Start time: 02/05/2017 10:50 AM End time: 02/05/2017 10:58 AM  Staffing Anesthesiologist: Marcene Duos Performed: anesthesiologist   Preanesthetic Checklist Completed: patient identified, site marked, surgical consent, pre-op evaluation, timeout performed, IV checked, risks and benefits discussed and monitors and equipment checked  Epidural Patient position: sitting Prep: site prepped and draped and DuraPrep Patient monitoring: continuous pulse ox and blood pressure Approach: midline Location: L4-L5 Injection technique: LOR air  Needle:  Needle type: Tuohy  Needle gauge: 17 G Needle length: 9 cm and 9 Needle insertion depth: 6 cm Catheter type: closed end flexible Catheter size: 19 Gauge Catheter at skin depth: 11 cm Test dose: negative  Assessment Events: blood not aspirated, injection not painful, no injection resistance, negative IV test and no paresthesia

## 2017-02-06 LAB — CBC
HCT: 28.7 % — ABNORMAL LOW (ref 36.0–46.0)
Hemoglobin: 9.8 g/dL — ABNORMAL LOW (ref 12.0–15.0)
MCH: 27.8 pg (ref 26.0–34.0)
MCHC: 34.1 g/dL (ref 30.0–36.0)
MCV: 81.5 fL (ref 78.0–100.0)
Platelets: 180 10*3/uL (ref 150–400)
RBC: 3.52 MIL/uL — ABNORMAL LOW (ref 3.87–5.11)
RDW: 13.8 % (ref 11.5–15.5)
WBC: 12.8 10*3/uL — ABNORMAL HIGH (ref 4.0–10.5)

## 2017-02-06 LAB — RPR: RPR Ser Ql: NONREACTIVE

## 2017-02-06 MED ORDER — PRENATAL MULTIVITAMIN CH
1.0000 | ORAL_TABLET | Freq: Every day | ORAL | 6 refills | Status: DC
Start: 2017-02-06 — End: 2017-12-05

## 2017-02-06 MED ORDER — SYNTHROID 125 MCG PO TABS: 125.0000 ug | ORAL_TABLET | Freq: Every day | ORAL | 1 refills | Status: DC

## 2017-02-06 MED ORDER — IBUPROFEN 600 MG PO TABS: 600.0000 mg | ORAL_TABLET | Freq: Four times a day (QID) | ORAL | 1 refills | Status: DC

## 2017-02-06 MED ORDER — RHO D IMMUNE GLOBULIN 1500 UNIT/2ML IJ SOSY
300.0000 ug | PREFILLED_SYRINGE | Freq: Once | INTRAMUSCULAR | Status: AC
  Administered 2017-02-06: 300 ug via INTRAVENOUS
  Filled 2017-02-06: qty 2

## 2017-02-06 NOTE — Discharge Summary (Signed)
Obstetric Discharge Summary Reason for Admission: induction of labor Prenatal Procedures: ultrasound Intrapartum Procedures: spontaneous vaginal delivery Postpartum Procedures: none Complications-Operative and Postpartum: none Hemoglobin  Date Value Ref Range Status  02/06/2017 9.8 (L) 12.0 - 15.0 g/dL Final    Comment:    REPEATED TO VERIFY DELTA CHECK NOTED    HCT  Date Value Ref Range Status  02/06/2017 28.7 (L) 36.0 - 46.0 % Final    Physical Exam:  General: alert and cooperative Lochia: appropriate Uterine Fundus: firm Incision: perineum intact DVT Evaluation: No evidence of DVT seen on physical exam. Negative Homan's sign. No cords or calf tenderness. No significant calf/ankle edema.  Discharge Diagnoses: Term Pregnancy-delivered  Discharge Information: Date: 02/06/2017 Activity: pelvic rest Diet: routine Medications: PNV, Ibuprofen and synthroid Condition: stable Instructions: refer to practice specific booklet Discharge to: home   Newborn Data: Live born female  Birth Weight: 9 lb 4 oz (4196 g) APGAR: 8, 9  Home with mother.  Jakeline Dave G 02/06/2017, 8:32 AM

## 2017-02-06 NOTE — Progress Notes (Signed)
Post Partum Day 1 Subjective: no complaints, up ad lib, voiding, tolerating PO, + flatus and desires early discharge  Objective: Blood pressure 116/71, pulse (!) 57, temperature 97.2 F (36.2 C), temperature source Oral, resp. rate 18, height  (1.626 m), weight 171 lb (77.6 kg), last menstrual period 05/06/2016, SpO2 100 %, unknown if currently breastfeeding.  Physical Exam:  General: alert and cooperative Lochia: appropriate Uterine Fundus: firm Incision: perineum intact DVT Evaluation: No evidence of DVT seen on physical exam. Negative Homan's sign. No cords or calf tenderness. No significant calf/ankle edema.   Recent Labs  02/05/17 0820 02/06/17 0530  HGB 12.5 9.8*  HCT 38.1 28.7*    Assessment/Plan: Discharge home, Breastfeeding and Circumcision prior to discharge   LOS: 1 day   Blessed Cotham G 02/06/2017, 8:20 AM

## 2017-02-06 NOTE — Progress Notes (Signed)
MOB was referred for history of depression/anxiety. * Referral screened out by Clinical Social Worker because none of the following criteria appear to apply: ~ History of anxiety/depression during this pregnancy. ~ Diagnosis of anxiety and/or depression within last 3 years OR * MOB's symptoms currently being treated with medication and/or therapy. Please contact the Clinical Social Worker if needs arise, or if MOB requests.

## 2017-02-06 NOTE — Lactation Note (Signed)
This note was copied from a baby's chart. Lactation Consultation Note  Patient Name: Brooke Galvan [id number] Date: 02/06/2017 Reason for consult: Follow-up assessment  Baby 19 hours old. Mom latching baby when this LC entered the room. Enc mom to turn baby facing mom--chest-to-chest--in order to obtain a deeper latch. Mom reported increased comfort. Mom had breast augmentation in 2007, but milk came to volume with 2 older children (4 and 2 years). Mom is hypothyroid on Levothyroxine, so enc mom to make sure her thyroid levels continue to be monitored. Mom aware of OP/BFSG and LC phone line assistance after D/C.   Maternal Data    Feeding Feeding Type: Breast Fed Length of feed: 20 min  LATCH Score/Interventions Latch: Grasps breast easily, tongue down, lips flanged, rhythmical sucking.  Audible Swallowing: A few with stimulation Intervention(s): Skin to skin  Type of Nipple: Everted at rest and after stimulation  Comfort (Breast/Nipple): Filling, red/small blisters or bruises, mild/mod discomfort  Problem noted: Mild/Moderate discomfort  Hold (Positioning): Assistance needed to correctly position infant at breast and maintain latch. Intervention(s): Position options;Breastfeeding basics reviewed  LATCH Score: 7  Lactation Tools Discussed/Used     Consult Status Consult Status: PRN    Sherlyn Hay 02/06/2017, 9:04 AM

## 2017-02-07 LAB — RH IG WORKUP (INCLUDES ABO/RH)
ABO/RH(D): A NEG
Fetal Screen: NEGATIVE
Gestational Age(Wks): 40
Unit division: 0

## 2017-02-09 LAB — TYPE AND SCREEN
ABO/RH(D): A NEG
Antibody Screen: POSITIVE
DAT, IgG: NEGATIVE
Unit division: 0
Unit division: 0

## 2017-02-09 LAB — BPAM RBC
Blood Product Expiration Date: 201805072359
Blood Product Expiration Date: 201805082359
Unit Type and Rh: 9500
Unit Type and Rh: 9500

## 2017-03-21 LAB — HM PAP SMEAR: HM Pap smear: NEGATIVE

## 2017-05-25 ENCOUNTER — Inpatient Hospital Stay (HOSPITAL_COMMUNITY): Admission: AD | Admit: 2017-05-25 | Payer: Self-pay | Source: Ambulatory Visit | Admitting: Obstetrics & Gynecology

## 2017-11-07 LAB — TSH: TSH: 0.15 — AB (ref 0.41–5.90)

## 2017-12-04 NOTE — Progress Notes (Signed)
Subjective:    Patient ID: Brooke Galvan, female    DOB: [birth year], 27 y.o.   MRN: [id number]  HPI:  Brooke Galvan is here to establish as a new pt.  She is a pleasant 27 year old female.  PMH: Hypothyroidism- currently on Levothyroxine 688mcg/daily, hx of anxiety/depression, hx of asthma.  She is a mother of three children ages 7010 months, 3, and 5 She drinks water all day and denies any tobacco/ETOH use. She participates in crossfit three times/week and feels that her overall health is excellent.  Patient Care Team    Relationship Specialty Notifications Start End  William Hamburgeranford, Sanaiyah Kirchhoff D, NP PCP - General Family Medicine  12/05/17   Dorisann FramesBalan, Bindubal, MD Consulting Physician Endocrinology  12/05/17   [facility]    12/05/17     Patient Active Problem List   Diagnosis Date Noted  . Healthcare maintenance 12/05/2017  . Hypothyroidism 12/05/2017  . Fatigue 12/05/2017  . Pregnancy 02/05/2017  . Active labor 06/17/2014     Past Medical History:  Diagnosis Date  . Anxiety   . Asthma    sports induced no meds now  . Depression    mild pp dep  . Headache(784.0)   . Hypothyroidism      Past Surgical History:  Procedure Laterality Date  . BREAST SURGERY  2007   aug  . WISDOM TOOTH EXTRACTION       Family History  Problem Relation Age of Onset  . Birth defects Brother        spina bifida and anacephalic  . Heart disease Maternal Grandmother   . Hypertension Maternal Grandfather   . Healthy Mother   . Healthy Father   . Anesthesia problems Neg Hx      Social History   Substance and Sexual Activity  Drug Use No     Social History   Substance and Sexual Activity  Alcohol Use No     Social History   Tobacco Use  Smoking Status Former Smoker  . Packs/day: 1.00  . Years: 1.00  . Pack years: 1.00  . Types: Cigarettes  . Last attempt to quit: 03/23/2010  . Years since quitting: 7.7  Smokeless Tobacco Never Used     Outpatient Encounter Medications as of  12/05/2017  Medication Sig  . ibuprofen (ADVIL,MOTRIN) 600 MG tablet Take 1 tablet (600 mg total) by mouth every 6 (six) hours.  . Multiple Vitamin (MULTIVITAMIN) tablet Take 1 tablet by mouth daily.  Marland Kitchen. levothyroxine (SYNTHROID, LEVOTHROID) 88 MCG tablet Take 1 tablet by mouth daily.  . [DISCONTINUED] Prenatal Vit-Fe Fumarate-FA (PRENATAL MULTIVITAMIN) TABS tablet Take 1 tablet by mouth daily.  . [DISCONTINUED] SYNTHROID 125 MCG tablet Take 1 tablet (125 mcg total) by mouth daily. as directed   No facility-administered encounter medications on file as of 12/05/2017.     Allergies: Influenza vaccines and Latex  Body mass index is 22.26 kg/m.  Blood pressure (!) 92/56, pulse 83, height 5' 5.25" (1.657 m), weight 134 lb 12.8 oz (61.1 kg), SpO2 97 %, currently breastfeeding.      Review of Systems  Constitutional: Positive for fatigue. Negative for activity change, appetite change, chills, diaphoresis, fever and unexpected weight change.  HENT: Negative for congestion.   Eyes: Negative for visual disturbance.  Respiratory: Negative for cough, chest tightness, shortness of breath, wheezing and stridor.   Cardiovascular: Negative for chest pain, palpitations and leg swelling.  Gastrointestinal: Negative for abdominal distention, abdominal pain, blood in stool, constipation, diarrhea,  nausea and vomiting.  Endocrine: Negative for cold intolerance, heat intolerance, polydipsia, polyphagia and polyuria.  Genitourinary: Negative for difficulty urinating, flank pain and hematuria.  Musculoskeletal: Negative for arthralgias, back pain, gait problem, joint swelling, myalgias, neck pain and neck stiffness.  Skin: Negative for color change, pallor, rash and wound.  Neurological: Negative for dizziness and headaches.  Psychiatric/Behavioral: Positive for sleep disturbance. Negative for decreased concentration, dysphoric mood, hallucinations, self-injury and suicidal ideas. The patient is not  nervous/anxious and is not hyperactive.        Sleep disturbance r/t to 3 young children       Objective:   Physical Exam  Constitutional: She is oriented to person, place, and time. She appears well-developed and well-nourished. No distress.  HENT:  Head: Normocephalic and atraumatic.  Right Ear: External ear normal.  Left Ear: External ear normal.  Eyes: Conjunctivae are normal. Pupils are equal, round, and reactive to light.  Cardiovascular: Normal rate, regular rhythm, normal heart sounds and intact distal pulses.  No murmur heard. Pulmonary/Chest: Effort normal and breath sounds normal. No respiratory distress. She has no wheezes. She has no rales. She exhibits no tenderness.  Neurological: She is alert and oriented to person, place, and time.  Skin: Skin is warm and dry. No rash noted. She is not diaphoretic. No erythema. No pallor.  Psychiatric: She has a normal mood and affect. Her behavior is normal. Judgment and thought content normal.  Nursing note and vitals reviewed.         Assessment & Plan:   1. Hypothyroidism, unspecified type   2. Fatigue, unspecified type   3. Healthcare maintenance     Hypothyroidism Currently on Levothyroxine once daily.  Healthcare maintenance Overall you are doing a FANTASTIC job with your health. Recommend fasting labs at your convenience and annual physical.    FOLLOW-UP:  Return in about 1 year (around 12/05/2018) for CPE, Fasting Labs.

## 2017-12-05 ENCOUNTER — Ambulatory Visit (INDEPENDENT_AMBULATORY_CARE_PROVIDER_SITE_OTHER): Payer: BLUE CROSS/BLUE SHIELD | Admitting: Adult Health

## 2017-12-05 ENCOUNTER — Encounter: Payer: Self-pay | Admitting: Adult Health

## 2017-12-05 ENCOUNTER — Other Ambulatory Visit: Payer: Self-pay | Admitting: Adult Health

## 2017-12-05 VITALS — BP 92/56 | HR 83 | Ht 65.25 in | Wt 134.8 lb

## 2017-12-05 DIAGNOSIS — R5383 Other fatigue: Secondary | ICD-10-CM | POA: Insufficient documentation

## 2017-12-05 DIAGNOSIS — Z0000 Encounter for general adult medical examination without abnormal findings: Secondary | ICD-10-CM

## 2017-12-05 DIAGNOSIS — E039 Hypothyroidism, unspecified: Secondary | ICD-10-CM

## 2017-12-05 NOTE — Patient Instructions (Signed)

## 2017-12-05 NOTE — Assessment & Plan Note (Signed)
Currently on Levothyroxine once daily.

## 2017-12-05 NOTE — Assessment & Plan Note (Signed)
Overall you are doing a FANTASTIC job with your health. Recommend fasting labs at your convenience and annual physical.

## 2018-01-07 ENCOUNTER — Other Ambulatory Visit: Payer: BLUE CROSS/BLUE SHIELD

## 2020-05-30 DIAGNOSIS — U071 COVID-19: Secondary | ICD-10-CM

## 2020-05-30 HISTORY — DX: COVID-19: U07.1

## 2020-09-14 DIAGNOSIS — M84441A Pathological fracture, right hand, initial encounter for fracture: Secondary | ICD-10-CM

## 2020-09-14 HISTORY — DX: Pathological fracture, right hand, initial encounter for fracture: M84.441A

## 2020-09-15 ENCOUNTER — Encounter (HOSPITAL_BASED_OUTPATIENT_CLINIC_OR_DEPARTMENT_OTHER): Payer: Self-pay | Admitting: Orthopedic Surgery

## 2020-09-15 ENCOUNTER — Other Ambulatory Visit: Payer: Self-pay

## 2020-09-15 NOTE — Progress Notes (Signed)
Spoke w/ via phone for pre-op interview---pt Lab needs dos----urine poct               Lab results------none COVID test ------09-17-2020 1500 Arrive at -------900 am 09-21-2020 NPO after MN NO Solid Food.  Clear liquids from MN until---800 am then npo Medications to take morning of surgery -----levothyroxine Diabetic medication -----n/a Patient Special Instructions -----none Pre-Op special Istructions -----none Patient verbalized understanding of instructions that were given at this phone interview. Patient denies shortness of breath, chest pain, fever, cough at this phone interview.

## 2020-09-17 ENCOUNTER — Other Ambulatory Visit (HOSPITAL_COMMUNITY)
Admission: RE | Admit: 2020-09-17 | Discharge: 2020-09-17 | Disposition: A | Discharge status: Home / Self Care | Payer: Self-pay | Source: Ambulatory Visit | Attending: Orthopedic Surgery | Admitting: Orthopedic Surgery

## 2020-09-17 ENCOUNTER — Other Ambulatory Visit (HOSPITAL_COMMUNITY): Payer: BLUE CROSS/BLUE SHIELD

## 2020-09-17 DIAGNOSIS — Z01812 Encounter for preprocedural laboratory examination: Secondary | ICD-10-CM | POA: Insufficient documentation

## 2020-09-17 DIAGNOSIS — Z20822 Contact with and (suspected) exposure to covid-19: Secondary | ICD-10-CM | POA: Insufficient documentation

## 2020-09-17 LAB — SARS CORONAVIRUS 2 (TAT 6-24 HRS): SARS Coronavirus 2: NEGATIVE

## 2020-09-21 ENCOUNTER — Ambulatory Visit (HOSPITAL_BASED_OUTPATIENT_CLINIC_OR_DEPARTMENT_OTHER): Payer: Self-pay | Admitting: Anesthesiology

## 2020-09-21 ENCOUNTER — Ambulatory Visit (HOSPITAL_BASED_OUTPATIENT_CLINIC_OR_DEPARTMENT_OTHER)
Admission: RE | Admit: 2020-09-21 | Discharge: 2020-09-21 | Disposition: A | Discharge status: Home / Self Care | Payer: Self-pay | Attending: Orthopedic Surgery | Admitting: Orthopedic Surgery

## 2020-09-21 ENCOUNTER — Encounter (HOSPITAL_BASED_OUTPATIENT_CLINIC_OR_DEPARTMENT_OTHER)
Admission: RE | Disposition: A | Discharge status: Home / Self Care | Payer: Self-pay | Source: Home / Self Care | Attending: Orthopedic Surgery

## 2020-09-21 ENCOUNTER — Encounter (HOSPITAL_BASED_OUTPATIENT_CLINIC_OR_DEPARTMENT_OTHER): Payer: Self-pay | Admitting: Orthopedic Surgery

## 2020-09-21 DIAGNOSIS — Y93A9 Activity, other involving cardiorespiratory exercise: Secondary | ICD-10-CM | POA: Insufficient documentation

## 2020-09-21 DIAGNOSIS — X58XXXA Exposure to other specified factors, initial encounter: Secondary | ICD-10-CM | POA: Insufficient documentation

## 2020-09-21 DIAGNOSIS — Z79899 Other long term (current) drug therapy: Secondary | ICD-10-CM | POA: Insufficient documentation

## 2020-09-21 DIAGNOSIS — S62306A Unspecified fracture of fifth metacarpal bone, right hand, initial encounter for closed fracture: Secondary | ICD-10-CM | POA: Insufficient documentation

## 2020-09-21 DIAGNOSIS — Z87891 Personal history of nicotine dependence: Secondary | ICD-10-CM | POA: Insufficient documentation

## 2020-09-21 DIAGNOSIS — Z8616 Personal history of COVID-19: Secondary | ICD-10-CM | POA: Insufficient documentation

## 2020-09-21 HISTORY — PX: CLOSED REDUCTION FINGER WITH PERCUTANEOUS PINNING: SHX5612

## 2020-09-21 HISTORY — PX: OPEN REDUCTION INTERNAL FIXATION (ORIF) METACARPAL: SHX6234

## 2020-09-21 HISTORY — DX: Migraine, unspecified, not intractable, without status migrainosus: G43.909

## 2020-09-21 LAB — POCT PREGNANCY, URINE: Preg Test, Ur: NEGATIVE

## 2020-09-21 SURGERY — OPEN REDUCTION INTERNAL FIXATION (ORIF) METACARPAL
Anesthesia: General | Site: Finger | Laterality: Right

## 2020-09-21 MED ORDER — KETOROLAC TROMETHAMINE 30 MG/ML IJ SOLN
INTRAMUSCULAR | Status: AC
  Filled 2020-09-21: qty 1

## 2020-09-21 MED ORDER — CEFAZOLIN SODIUM-DEXTROSE 2-4 GM/100ML-% IV SOLN
2.0000 g | INTRAVENOUS | Status: AC
Start: 2020-09-21 — End: 2020-09-21
  Administered 2020-09-21: 2 g via INTRAVENOUS

## 2020-09-21 MED ORDER — PROPOFOL 10 MG/ML IV BOLUS
INTRAVENOUS | Status: DC | PRN
  Administered 2020-09-21: 170 mg via INTRAVENOUS

## 2020-09-21 MED ORDER — LIDOCAINE 2% (20 MG/ML) 5 ML SYRINGE
INTRAMUSCULAR | Status: DC | PRN
  Administered 2020-09-21: 80 mg via INTRAVENOUS

## 2020-09-21 MED ORDER — MIDAZOLAM HCL 2 MG/2ML IJ SOLN
INTRAMUSCULAR | Status: AC
  Filled 2020-09-21: qty 2

## 2020-09-21 MED ORDER — KETOROLAC TROMETHAMINE 30 MG/ML IJ SOLN: 30.0000 mg | Freq: Once | INTRAMUSCULAR | Status: DC | PRN

## 2020-09-21 MED ORDER — KETOROLAC TROMETHAMINE 30 MG/ML IJ SOLN
INTRAMUSCULAR | Status: DC | PRN
  Administered 2020-09-21: 30 mg via INTRAVENOUS

## 2020-09-21 MED ORDER — PROPOFOL 10 MG/ML IV BOLUS
INTRAVENOUS | Status: AC
  Filled 2020-09-21: qty 20

## 2020-09-21 MED ORDER — LACTATED RINGERS IV SOLN: INTRAVENOUS | Status: DC

## 2020-09-21 MED ORDER — FENTANYL CITRATE (PF) 100 MCG/2ML IJ SOLN
25.0000 ug | INTRAMUSCULAR | Status: DC | PRN
  Administered 2020-09-21: 25 ug via INTRAVENOUS

## 2020-09-21 MED ORDER — OXYCODONE HCL 5 MG PO TABS
5.0000 mg | ORAL_TABLET | Freq: Once | ORAL | Status: AC | PRN
  Administered 2020-09-21: 5 mg via ORAL

## 2020-09-21 MED ORDER — ONDANSETRON HCL 4 MG/2ML IJ SOLN
INTRAMUSCULAR | Status: AC
  Filled 2020-09-21: qty 2

## 2020-09-21 MED ORDER — FENTANYL CITRATE (PF) 100 MCG/2ML IJ SOLN
INTRAMUSCULAR | Status: AC
  Filled 2020-09-21: qty 2

## 2020-09-21 MED ORDER — OXYCODONE HCL 5 MG PO TABS
ORAL_TABLET | ORAL | Status: AC
  Filled 2020-09-21: qty 1

## 2020-09-21 MED ORDER — BUPIVACAINE HCL (PF) 0.5 % IJ SOLN
INTRAMUSCULAR | Status: DC | PRN
  Administered 2020-09-21: 8 mL

## 2020-09-21 MED ORDER — DEXAMETHASONE SODIUM PHOSPHATE 10 MG/ML IJ SOLN
INTRAMUSCULAR | Status: DC | PRN
  Administered 2020-09-21: 10 mg via INTRAVENOUS

## 2020-09-21 MED ORDER — MIDAZOLAM HCL 5 MG/5ML IJ SOLN
INTRAMUSCULAR | Status: DC | PRN
  Administered 2020-09-21: 2 mg via INTRAVENOUS

## 2020-09-21 MED ORDER — DEXAMETHASONE SODIUM PHOSPHATE 10 MG/ML IJ SOLN
INTRAMUSCULAR | Status: AC
  Filled 2020-09-21: qty 1

## 2020-09-21 MED ORDER — FENTANYL CITRATE (PF) 100 MCG/2ML IJ SOLN
INTRAMUSCULAR | Status: DC | PRN
  Administered 2020-09-21 (×2): 50 ug via INTRAVENOUS

## 2020-09-21 MED ORDER — HYDROCODONE-ACETAMINOPHEN 5-325 MG PO TABS: 1.0000 | ORAL_TABLET | ORAL | 0 refills | Status: AC | PRN

## 2020-09-21 MED ORDER — ONDANSETRON HCL 4 MG/2ML IJ SOLN
INTRAMUSCULAR | Status: DC | PRN
  Administered 2020-09-21: 4 mg via INTRAVENOUS

## 2020-09-21 MED ORDER — OXYCODONE HCL 5 MG/5ML PO SOLN: 5.0000 mg | Freq: Once | ORAL | Status: AC | PRN

## 2020-09-21 MED ORDER — LIDOCAINE HCL (PF) 2 % IJ SOLN
INTRAMUSCULAR | Status: AC
  Filled 2020-09-21: qty 5

## 2020-09-21 MED ORDER — PROMETHAZINE HCL 25 MG/ML IJ SOLN: 6.2500 mg | INTRAMUSCULAR | Status: DC | PRN

## 2020-09-21 MED ORDER — ACETAMINOPHEN 500 MG PO TABS
1000.0000 mg | ORAL_TABLET | Freq: Once | ORAL | Status: AC
  Administered 2020-09-21: 1000 mg via ORAL

## 2020-09-21 MED ORDER — ACETAMINOPHEN 500 MG PO TABS
ORAL_TABLET | ORAL | Status: AC
  Filled 2020-09-21: qty 2

## 2020-09-21 MED ORDER — CEFAZOLIN SODIUM-DEXTROSE 2-4 GM/100ML-% IV SOLN
INTRAVENOUS | Status: AC
  Filled 2020-09-21: qty 100

## 2020-09-21 SURGICAL SUPPLY — 38 items
APL PRP STRL LF DISP 70% ISPRP (MISCELLANEOUS) ×2
BNDG CMPR 9X4 STRL LF SNTH (GAUZE/BANDAGES/DRESSINGS) ×2
BNDG ELASTIC 4X5.8 VLCR STR LF (GAUZE/BANDAGES/DRESSINGS) ×3 IMPLANT
BNDG ESMARK 4X9 LF (GAUZE/BANDAGES/DRESSINGS) ×3 IMPLANT
CHLORAPREP W/TINT 26 (MISCELLANEOUS) ×3 IMPLANT
COVER BACK TABLE 60X90IN (DRAPES) ×3 IMPLANT
COVER WAND RF STERILE (DRAPES) ×3 IMPLANT
CUFF TOURN SGL QUICK 18X4 (TOURNIQUET CUFF) IMPLANT
DRAPE C-ARM 35X43 STRL (DRAPES) ×3 IMPLANT
DRAPE EXTREMITY T 121X128X90 (DISPOSABLE) ×3 IMPLANT
DRAPE IMP U-DRAPE 54X76 (DRAPES) ×3 IMPLANT
DRAPE SURG 17X23 STRL (DRAPES) IMPLANT
GAUZE SPONGE 4X4 12PLY STRL (GAUZE/BANDAGES/DRESSINGS) ×3 IMPLANT
GAUZE SPONGE 4X4 12PLY STRL LF (GAUZE/BANDAGES/DRESSINGS) ×1 IMPLANT
GAUZE XEROFORM 1X8 LF (GAUZE/BANDAGES/DRESSINGS) ×2 IMPLANT
GLOVE BIO SURGEON STRL SZ7.5 (GLOVE) ×6 IMPLANT
GLOVE BIOGEL PI IND STRL 8 (GLOVE) ×4 IMPLANT
GLOVE BIOGEL PI INDICATOR 8 (GLOVE) ×2
GOWN STRL REUS W/ TWL LRG LVL3 (GOWN DISPOSABLE) ×4 IMPLANT
GOWN STRL REUS W/ TWL XL LVL3 (GOWN DISPOSABLE) ×2 IMPLANT
GOWN STRL REUS W/TWL LRG LVL3 (GOWN DISPOSABLE) ×6
GOWN STRL REUS W/TWL XL LVL3 (GOWN DISPOSABLE) ×3
KIT TURNOVER CYSTO (KITS) ×3 IMPLANT
NDL HYPO 25X1 1.5 SAFETY (NEEDLE) ×1 IMPLANT
NEEDLE HYPO 25X1 1.5 SAFETY (NEEDLE) ×3 IMPLANT
PACK BASIN DAY SURGERY FS (CUSTOM PROCEDURE TRAY) ×3 IMPLANT
PAD CAST 4YDX4 CTTN HI CHSV (CAST SUPPLIES) ×2 IMPLANT
PADDING CAST ABS 4INX4YD NS (CAST SUPPLIES) ×1
PADDING CAST ABS COTTON 4X4 ST (CAST SUPPLIES) ×2 IMPLANT
PADDING CAST COTTON 4X4 STRL (CAST SUPPLIES) ×3
SPLINT FIBERGLASS 3X35 (CAST SUPPLIES) ×10 IMPLANT
SPLINT PLASTER CAST XFAST 3X15 (CAST SUPPLIES) IMPLANT
SPLINT PLASTER XTRA FASTSET 3X (CAST SUPPLIES)
SYR BULB EAR ULCER 3OZ GRN STR (SYRINGE) ×3 IMPLANT
SYR CONTROL 10ML LL (SYRINGE) ×3 IMPLANT
TOWEL OR 17X26 10 PK STRL BLUE (TOWEL DISPOSABLE) ×3 IMPLANT
TOWEL OR NON WOVEN STRL DISP B (DISPOSABLE) ×3 IMPLANT
UNDERPAD 30X36 HEAVY ABSORB (UNDERPADS AND DIAPERS) ×3 IMPLANT

## 2020-09-21 NOTE — Discharge Instructions (Signed)
Post Anesthesia Home Care Instructions  Activity: Get plenty of rest for the remainder of the day. A responsible adult should stay with you for 24 hours following the procedure.  For the next 24 hours, DO NOT: -Drive a car -Advertising copywriter -Drink alcoholic beverages -Take any medication unless instructed by your physician -Make any legal decisions or sign important papers.  Meals: Start with liquid foods such as gelatin or soup. Progress to regular foods as tolerated. Avoid greasy, spicy, heavy foods. If nausea and/or vomiting occur, drink only clear liquids until the nausea and/or vomiting subsides. Call your physician if vomiting continues.  Special Instructions/Symptoms: Your throat may feel dry or sore from the anesthesia or the breathing tube placed in your throat during surgery. If this causes discomfort, gargle with warm salt water. The discomfort should disappear within 24 hours.  If you had a scopolamine patch placed behind your ear for the management of post- operative nausea and/or vomiting:  1. The medication in the patch is effective for 72 hours, after which it should be removed.  Wrap patch in a tissue and discard in the trash. Wash hands thoroughly with soap and water. 2. You may remove the patch earlier than 72 hours if you experience unpleasant side effects which may include dry mouth, dizziness or visual disturbances. 3. Avoid touching the patch. Wash your hands with soap and water after contact with the patch.   Keep wrist elevated with ice as much as possible to reduce pain and swelling. If needed, you may increase pain medication for the first few days post op to 2 tablets every 4 hours.  Stop as needed pain medication as soon as you are able.  Diet: As you were doing prior to hospitalization   Shower:  You have a splint on, leave the splint in place and keep the splint dry with a plastic bag.  Dressing:  You have a splint - leave the splint in place and we  will change your bandages during your first follow-up appointment.    Activity:  Increase activity slowly as tolerated, but follow the weight bearing instructions below.  The rules on driving is that you can not be taking narcotics while you drive, and you must feel in control of the vehicle.    Weight Bearing:  Non weight bearing affected wrist.  Sling for comfort.   To prevent constipation: you may use a stool softener such as -  Colace (over the counter) 100 mg by mouth twice a day  Drink plenty of fluids (prune juice may be helpful) and high fiber foods Miralax (over the counter) for constipation as needed.    Itching:  If you experience itching with your medications, try taking only a single pain pill, or even half a pain pill at a time.  You can also use benadryl over the counter for itching or also to help with sleep.   Precautions:  If you experience chest pain or shortness of breath - call 911 immediately for transfer to the hospital emergency department!!  If you develop a fever greater that 101 F, purulent drainage from wound, increased redness or drainage from wound, or calf pain -- Call the office at [phone]                                         Follow- Up Appointment:  Please call for an appointment to  be seen in 2 weeks [place] - [phone]

## 2020-09-21 NOTE — Transfer of Care (Signed)
Immediate Anesthesia Transfer of Care Note  Patient: Brooke Galvan  Procedure(s) Performed: OPEN REDUCTION INTERNAL FIXATION (ORIF) METACARPAL (Right Finger) CLOSED REDUCTION PERCUTANEOUS PINNING RIGHT FIFTH METACARPAL (Finger)  Patient Location: PACU  Anesthesia Type:General  Level of Consciousness: awake, alert , oriented and patient cooperative  Airway & Oxygen Therapy: Patient Spontanous Breathing  Post-op Assessment: Report given to RN and Post -op Vital signs reviewed and stable  Post vital signs: Reviewed and stable  Last Vitals:  Vitals Value Taken Time  BP    Temp    Pulse    Resp    SpO2      Last Pain:  Vitals:   09/21/20 0907  TempSrc: Oral  PainSc: 1       Patients Stated Pain Goal: 5 (09/21/20 0907)  Complications: No complications documented.

## 2020-09-21 NOTE — Op Note (Signed)
09/21/2020  11:16 AM  PATIENT:  Brooke Galvan    PRE-OPERATIVE DIAGNOSIS:  RIGHT 5TH METACARPAL FRACTURE  POST-OPERATIVE DIAGNOSIS:  Same  PROCEDURE:  OPEN REDUCTION INTERNAL FIXATION (ORIF) METACARPAL, CLOSED REDUCTION PERCUTANEOUS PINNING RIGHT FIFTH METACARPAL  SURGEON:  Sheral Apley, MD  ASSISTANT: Daun Peacock, PA-C, he was present and scrubbed throughout the case, critical for completion in a timely fashion, and for retraction, instrumentation, and closure.   ANESTHESIA:   gen  PREOPERATIVE INDICATIONS:  Brooke Galvan is a  29 y.o. female with a diagnosis of RIGHT 5TH METACARPAL FRACTURE who failed conservative measures and elected for surgical management.    The risks benefits and alternatives were discussed with the patient preoperatively including but not limited to the risks of infection, bleeding, nerve injury, cardiopulmonary complications, the need for revision surgery, among others, and the patient was willing to proceed.  OPERATIVE IMPLANTS: K wires  OPERATIVE FINDINGS: rotational malalignment  BLOOD LOSS: min  COMPLICATIONS: none  TOURNIQUET TIME: none  OPERATIVE PROCEDURE:  Patient was identified in the preoperative holding area and site was marked by me She was transported to the operating theater and placed on the table in supine position taking care to pad all bony prominences. After a preincinduction time out anesthesia was induced. The right upper extremity was prepped and draped in normal sterile fashion and a pre-incision timeout was performed. She received ancef for preoperative antibiotics.   After an appropriate timeout I performed a closed manipulation and reduced her fracture correcting her rotational malalignment.  Next I placed 262 K wires across her fracture intramedullary and was very happy with the stability with these in place.  I took multiple x-rays of her hand to confirm appropriate placement and fracture alignment.  I placed a  digital block with 7 cc of Marcaine plain  Sterile dressing was then applied a ulnar gutter splint and she was awoken and taken to the PACU in stable condition I again prior  POST OPERATIVE PLAN: mobilize for dvt px

## 2020-09-21 NOTE — Anesthesia Postprocedure Evaluation (Signed)
Anesthesia Post Note  Patient: Brooke Galvan  Procedure(s) Performed: OPEN REDUCTION INTERNAL FIXATION (ORIF) METACARPAL (Right Finger) CLOSED REDUCTION PERCUTANEOUS PINNING RIGHT FIFTH METACARPAL (Finger)     Patient location during evaluation: PACU Anesthesia Type: General Level of consciousness: awake and alert Pain management: pain level controlled Vital Signs Assessment: post-procedure vital signs reviewed and stable Respiratory status: spontaneous breathing, nonlabored ventilation, respiratory function stable and patient connected to nasal cannula oxygen Cardiovascular status: blood pressure returned to baseline and stable Postop Assessment: no apparent nausea or vomiting Anesthetic complications: no   No complications documented.  Last Vitals:  Vitals:   09/21/20 1233 09/21/20 1308  BP:  101/68  Pulse: (!) 53 (!) 55  Resp: 14 18  Temp: 36.8 C   SpO2: 98% 100%    Last Pain:  Vitals:   09/21/20 1308  TempSrc:   PainSc: 3                  Cezar Misiaszek P Elloise Roark

## 2020-09-21 NOTE — Anesthesia Preprocedure Evaluation (Addendum)
Anesthesia Evaluation  Patient identified by MRN, date of birth, ID band Patient awake    Reviewed: Allergy & Precautions, NPO status , Patient's Chart, lab work & pertinent test results  Airway Mallampati: II  TM Distance: >3 FB Neck ROM: Full    Dental no notable dental hx.    Pulmonary asthma , former smoker,    Pulmonary exam normal breath sounds clear to auscultation       Cardiovascular negative cardio ROS Normal cardiovascular exam Rhythm:Regular Rate:Normal     Neuro/Psych  Headaches, Anxiety    GI/Hepatic negative GI ROS, Neg liver ROS,   Endo/Other  Hypothyroidism   Renal/GU negative Renal ROS     Musculoskeletal negative musculoskeletal ROS (+)   Abdominal   Peds  Hematology negative hematology ROS (+)   Anesthesia Other Findings RIGHT 5TH METACARPAL FRACTURE  Reproductive/Obstetrics hcg negative                            Anesthesia Physical Anesthesia Plan  ASA: II  Anesthesia Plan: General   Post-op Pain Management:    Induction: Intravenous  PONV Risk Score and Plan: 3 and Ondansetron, Dexamethasone, Midazolam and Treatment may vary due to age or medical condition  Airway Management Planned: LMA  Additional Equipment:   Intra-op Plan:   Post-operative Plan: Extubation in OR  Informed Consent: I have reviewed the patients History and Physical, chart, labs and discussed the procedure including the risks, benefits and alternatives for the proposed anesthesia with the patient or authorized representative who has indicated his/her understanding and acceptance.     Dental advisory given  Plan Discussed with: CRNA  Anesthesia Plan Comments:         Anesthesia Quick Evaluation

## 2020-09-21 NOTE — H&P (Signed)
ORTHOPAEDIC CONSULTATION  REQUESTING PHYSICIAN: Renette Butters, MD  Chief Complaint: R 5th MC fracture  HPI: Brooke Galvan is a 29 y.o. female who complains of  injuryb doing box jumps  Past Medical History:  Diagnosis Date  . Anxiety   . Asthma    sports induced no meds now  . COVID 05/30/2020   loss of taste and smell for 1 month all symptoms resolved now  . Hypothyroidism   . Migraine   . Pathologic metacarpal fracture, right, initial encounter 09/14/2020   splint soft cast on   Past Surgical History:  Procedure Laterality Date  . BREAST SURGERY  2007   aug  . WISDOM TOOTH EXTRACTION  age 62   Social History   Socioeconomic History  . Marital status: Married    Spouse name: Not on file  . Number of children: Not on file  . Years of education: Not on file  . Highest education level: Not on file  Occupational History  . Not on file  Tobacco Use  . Smoking status: Former Smoker    Packs/day: 1.00    Years: 1.00    Pack years: 1.00    Types: Cigarettes    Quit date: 03/23/2010    Years since quitting: 10.5  . Smokeless tobacco: Never Used  Vaping Use  . Vaping Use: Never used  Substance and Sexual Activity  . Alcohol use: No  . Drug use: No  . Sexual activity: Yes    Birth control/protection: None  Other Topics Concern  . Not on file  Social History Narrative  . Not on file   Social Determinants of Health   Financial Resource Strain:   . Difficulty of Paying Living Expenses: Not on file  Food Insecurity:   . Worried About Charity fundraiser in the Last Year: Not on file  . Ran Out of Food in the Last Year: Not on file  Transportation Needs:   . Lack of Transportation (Medical): Not on file  . Lack of Transportation (Non-Medical): Not on file  Physical Activity:   . Days of Exercise per Week: Not on file  . Minutes of Exercise per Session: Not on file  Stress:   . Feeling of Stress : Not on file  Social Connections:   . Frequency  of Communication with Friends and Family: Not on file  . Frequency of Social Gatherings with Friends and Family: Not on file  . Attends Religious Services: Not on file  . Active Member of Clubs or Organizations: Not on file  . Attends Archivist Meetings: Not on file  . Marital Status: Not on file   Family History  Problem Relation Age of Onset  . Birth defects Brother        spina bifida and anacephalic  . Heart disease Maternal Grandmother   . Hypertension Maternal Grandfather   . Healthy Mother   . Healthy Father   . Anesthesia problems Neg Hx    Allergies  Allergen Reactions  . Influenza Vaccines Swelling    Swelling of tongue  . Latex Other (See Comments)    redness   Prior to Admission medications   Medication Sig Start Date End Date Taking? Authorizing Provider  clonazePAM (KLONOPIN) 0.5 MG tablet Take 0.5 mg by mouth at bedtime.   Yes [provider]  levothyroxine (SYNTHROID, LEVOTHROID) 88 MCG tablet Take 1 tablet by mouth daily. 11/09/17  Yes [provider]  No results found.  Positive ROS: All other systems have been reviewed and were otherwise negative with the exception of those mentioned in the HPI and as above.  Labs cbc No results for input(s): WBC, HGB, HCT, PLT in the last 72 hours.  Labs inflam No results for input(s): CRP in the last 72 hours.  Invalid input(s): ESR  Labs coag No results for input(s): INR, PTT in the last 72 hours.  Invalid input(s): PT  No results for input(s): NA, K, CL, CO2, GLUCOSE, BUN, CREATININE, CALCIUM in the last 72 hours.  Physical Exam: Vitals:   09/21/20 0907  BP: (!) 125/96  Pulse: 69  Resp: 14  Temp: 97.9 F (36.6 C)  SpO2: 100%   General: Alert, no acute distress Cardiovascular: No pedal edema Respiratory: No cyanosis, no use of accessory musculature GI: No organomegaly, abdomen is soft and non-tender Skin: No lesions in the area of chief complaint other than those listed  below in MSK exam.  Neurologic: Sensation intact distally save for the below mentioned MSK exam Psychiatric: Patient is competent for consent with normal mood and affect Lymphatic: No axillary or cervical lymphadenopathy  MUSCULOSKELETAL:  Rotational malalignment to the small finger on the R, NVI Other extremities are atraumatic with painless ROM and NVI.  Assessment: R 5th mc fx  Plan: CRPP vs ORIF today    Renette Butters, MD    09/21/2020 9:32 AM

## 2020-09-21 NOTE — Anesthesia Procedure Notes (Signed)
Procedure Name: LMA Insertion Date/Time: 09/21/2020 10:59 AM Performed by: Bishop Limbo, CRNA Pre-anesthesia Checklist: Patient identified, Emergency Drugs available, Suction available and Patient being monitored Patient Re-evaluated:Patient Re-evaluated prior to induction Oxygen Delivery Method: Circle System Utilized Preoxygenation: Pre-oxygenation with 100% oxygen Induction Type: IV induction Ventilation: Mask ventilation without difficulty LMA: LMA inserted LMA Size: 4.0 Number of attempts: 1 Placement Confirmation: positive ETCO2 Tube secured with: Tape Dental Injury: Teeth and Oropharynx as per pre-operative assessment

## 2020-09-22 ENCOUNTER — Encounter (HOSPITAL_BASED_OUTPATIENT_CLINIC_OR_DEPARTMENT_OTHER): Payer: Self-pay | Admitting: Orthopedic Surgery

## 2024-01-04 ENCOUNTER — Other Ambulatory Visit: Payer: Self-pay | Admitting: Endocrinology

## 2024-01-04 DIAGNOSIS — E049 Nontoxic goiter, unspecified: Secondary | ICD-10-CM

## 2024-01-08 ENCOUNTER — Ambulatory Visit
Admission: RE | Admit: 2024-01-08 | Discharge: 2024-01-08 | Disposition: A | Discharge status: Home / Self Care | Payer: Self-pay | Source: Ambulatory Visit | Attending: Endocrinology | Admitting: Endocrinology

## 2024-01-08 DIAGNOSIS — E049 Nontoxic goiter, unspecified: Secondary | ICD-10-CM

## 2024-08-12 ENCOUNTER — Emergency Department (HOSPITAL_BASED_OUTPATIENT_CLINIC_OR_DEPARTMENT_OTHER): Payer: Self-pay

## 2024-08-12 ENCOUNTER — Encounter (HOSPITAL_BASED_OUTPATIENT_CLINIC_OR_DEPARTMENT_OTHER): Payer: Self-pay | Admitting: Emergency Medicine

## 2024-08-12 ENCOUNTER — Other Ambulatory Visit: Payer: Self-pay

## 2024-08-12 ENCOUNTER — Emergency Department (HOSPITAL_BASED_OUTPATIENT_CLINIC_OR_DEPARTMENT_OTHER)
Admission: EM | Admit: 2024-08-12 | Discharge: 2024-08-12 | Disposition: A | Discharge status: Home / Self Care | Payer: Self-pay | Attending: Emergency Medicine | Admitting: Emergency Medicine

## 2024-08-12 DIAGNOSIS — M542 Cervicalgia: Secondary | ICD-10-CM | POA: Diagnosis present

## 2024-08-12 DIAGNOSIS — Y92410 Unspecified street and highway as the place of occurrence of the external cause: Secondary | ICD-10-CM | POA: Diagnosis not present

## 2024-08-12 DIAGNOSIS — S63502A Unspecified sprain of left wrist, initial encounter: Secondary | ICD-10-CM | POA: Insufficient documentation

## 2024-08-12 DIAGNOSIS — S161XXA Strain of muscle, fascia and tendon at neck level, initial encounter: Secondary | ICD-10-CM | POA: Insufficient documentation

## 2024-08-12 DIAGNOSIS — R519 Headache, unspecified: Secondary | ICD-10-CM | POA: Insufficient documentation

## 2024-08-12 MED ORDER — OXYCODONE-ACETAMINOPHEN 5-325 MG PO TABS
2.0000 | ORAL_TABLET | Freq: Once | ORAL | Status: AC
  Administered 2024-08-12: 2 via ORAL
  Filled 2024-08-12: qty 2
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
## 2024-08-12 NOTE — Discharge Instructions (Signed)
 As we discussed, I would take 600 mg of ibuprofen  every 6 hours as needed for pain.  You can add on Tylenol  at the 3 to 4-hour mark starting tomorrow.  I would take tomorrow to rest and then the next day start getting up and moving as this will help your recovery time.  I would like for you to follow-up with your primary care doctor.  You may return to the emergency department for any worsening symptoms.

## 2024-08-12 NOTE — ED Notes (Signed)
 Patient transported to X-ray
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
